# Patient Record
Sex: Male | Born: 1990 | Race: Black or African American | Hispanic: No | Marital: Single | Smoking: Current every day smoker
Health system: Southern US, Community
[De-identification: ages and names within clinical notes are randomized; demographics above are authoritative.]

## PROBLEM LIST (undated history)

## (undated) DIAGNOSIS — B20 Human immunodeficiency virus [HIV] disease: Secondary | ICD-10-CM

## (undated) HISTORY — DX: Human immunodeficiency virus (HIV) disease: B20

## (undated) HISTORY — PX: NO PAST SURGERIES: SHX2092

---
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
## 2011-02-10 ENCOUNTER — Inpatient Hospital Stay (INDEPENDENT_AMBULATORY_CARE_PROVIDER_SITE_OTHER): Admit: 2011-02-10 | Discharge: 2011-02-10 | Disposition: A | Discharge status: Home / Self Care | Payer: Medicaid Other

## 2011-02-10 ENCOUNTER — Emergency Department (HOSPITAL_COMMUNITY)
Admission: EM | Admit: 2011-02-10 | Discharge: 2011-02-11 | Discharge status: Against Medical Advice | Payer: Medicaid Other | Attending: Emergency Medicine | Admitting: Emergency Medicine

## 2011-02-10 DIAGNOSIS — R040 Epistaxis: Secondary | ICD-10-CM | POA: Insufficient documentation

## 2011-02-10 DIAGNOSIS — J3489 Other specified disorders of nose and nasal sinuses: Secondary | ICD-10-CM | POA: Insufficient documentation

## 2011-02-10 DIAGNOSIS — R51 Headache: Secondary | ICD-10-CM | POA: Insufficient documentation

## 2011-02-10 DIAGNOSIS — J019 Acute sinusitis, unspecified: Secondary | ICD-10-CM

## 2011-03-06 ENCOUNTER — Emergency Department (HOSPITAL_COMMUNITY)
Admission: EM | Admit: 2011-03-06 | Discharge: 2011-03-06 | Disposition: A | Discharge status: Home / Self Care | Payer: PRIVATE HEALTH INSURANCE | Attending: Emergency Medicine | Admitting: Emergency Medicine

## 2011-03-06 DIAGNOSIS — B86 Scabies: Secondary | ICD-10-CM | POA: Insufficient documentation

## 2011-03-06 DIAGNOSIS — R21 Rash and other nonspecific skin eruption: Secondary | ICD-10-CM | POA: Insufficient documentation

## 2011-06-15 ENCOUNTER — Emergency Department (HOSPITAL_COMMUNITY)
Admission: EM | Admit: 2011-06-15 | Discharge: 2011-06-15 | Disposition: A | Discharge status: Home / Self Care | Payer: PRIVATE HEALTH INSURANCE | Attending: Emergency Medicine | Admitting: Emergency Medicine

## 2011-06-15 DIAGNOSIS — M25569 Pain in unspecified knee: Secondary | ICD-10-CM | POA: Insufficient documentation

## 2011-06-15 DIAGNOSIS — M25529 Pain in unspecified elbow: Secondary | ICD-10-CM | POA: Insufficient documentation

## 2011-08-18 ENCOUNTER — Emergency Department (HOSPITAL_COMMUNITY): Payer: Medicaid Other

## 2011-08-18 ENCOUNTER — Emergency Department (HOSPITAL_COMMUNITY)
Admission: EM | Admit: 2011-08-18 | Discharge: 2011-08-18 | Discharge status: Against Medical Advice | Payer: Medicaid Other | Attending: Emergency Medicine | Admitting: Emergency Medicine

## 2011-08-18 DIAGNOSIS — S91109A Unspecified open wound of unspecified toe(s) without damage to nail, initial encounter: Secondary | ICD-10-CM | POA: Insufficient documentation

## 2011-08-18 DIAGNOSIS — IMO0002 Reserved for concepts with insufficient information to code with codable children: Secondary | ICD-10-CM | POA: Insufficient documentation

## 2011-08-18 DIAGNOSIS — M79609 Pain in unspecified limb: Secondary | ICD-10-CM | POA: Insufficient documentation

## 2011-08-18 DIAGNOSIS — Y92009 Unspecified place in unspecified non-institutional (private) residence as the place of occurrence of the external cause: Secondary | ICD-10-CM | POA: Insufficient documentation

## 2011-09-02 ENCOUNTER — Inpatient Hospital Stay (INDEPENDENT_AMBULATORY_CARE_PROVIDER_SITE_OTHER)
Admission: RE | Admit: 2011-09-02 | Discharge: 2011-09-02 | Disposition: A | Discharge status: Home / Self Care | Payer: Medicaid Other | Source: Ambulatory Visit | Attending: Family Medicine | Admitting: Family Medicine

## 2011-09-02 ENCOUNTER — Ambulatory Visit (INDEPENDENT_AMBULATORY_CARE_PROVIDER_SITE_OTHER): Payer: Medicaid Other

## 2011-09-02 DIAGNOSIS — M25519 Pain in unspecified shoulder: Secondary | ICD-10-CM

## 2011-09-02 DIAGNOSIS — W19XXXA Unspecified fall, initial encounter: Secondary | ICD-10-CM

## 2011-09-02 DIAGNOSIS — M79609 Pain in unspecified limb: Secondary | ICD-10-CM

## 2011-12-17 ENCOUNTER — Emergency Department (HOSPITAL_COMMUNITY)
Admission: EM | Admit: 2011-12-17 | Discharge: 2011-12-17 | Disposition: A | Discharge status: Home / Self Care | Payer: Medicaid Other | Attending: Emergency Medicine | Admitting: Emergency Medicine

## 2011-12-17 ENCOUNTER — Encounter: Payer: Self-pay | Admitting: Emergency Medicine

## 2011-12-17 DIAGNOSIS — R10819 Abdominal tenderness, unspecified site: Secondary | ICD-10-CM | POA: Insufficient documentation

## 2011-12-17 DIAGNOSIS — R197 Diarrhea, unspecified: Secondary | ICD-10-CM | POA: Insufficient documentation

## 2011-12-17 DIAGNOSIS — R509 Fever, unspecified: Secondary | ICD-10-CM | POA: Insufficient documentation

## 2011-12-17 DIAGNOSIS — R112 Nausea with vomiting, unspecified: Secondary | ICD-10-CM | POA: Insufficient documentation

## 2011-12-17 LAB — DIFFERENTIAL
Eosinophils Absolute: 0.2 10*3/uL (ref 0.0–0.7)
Eosinophils Relative: 2 % (ref 0–5)
Lymphocytes Relative: 15 % (ref 12–46)
Lymphs Abs: 1.8 10*3/uL (ref 0.7–4.0)
Monocytes Absolute: 0.8 10*3/uL (ref 0.1–1.0)
Monocytes Relative: 6 % (ref 3–12)
Neutro Abs: 9.4 10*3/uL — ABNORMAL HIGH (ref 1.7–7.7)

## 2011-12-17 LAB — CBC
Platelets: 254 10*3/uL (ref 150–400)
RDW: 11.8 % (ref 11.5–15.5)

## 2011-12-17 LAB — COMPREHENSIVE METABOLIC PANEL
ALT: 18 U/L (ref 0–53)
Albumin: 4.1 g/dL (ref 3.5–5.2)
BUN: 10 mg/dL (ref 6–23)
CO2: 23 mEq/L (ref 19–32)
Chloride: 101 mEq/L (ref 96–112)
Sodium: 136 mEq/L (ref 135–145)

## 2011-12-17 LAB — LIPASE, BLOOD: Lipase: 20 U/L (ref 11–59)

## 2011-12-17 MED ORDER — ONDANSETRON 8 MG PO TBDP
8.0000 mg | ORAL_TABLET | Freq: Once | ORAL | Status: AC
  Administered 2011-12-17: 8 mg via ORAL
  Filled 2011-12-17: qty 1

## 2011-12-17 MED ORDER — ONDANSETRON 4 MG PO TBDP: 4.0000 mg | ORAL_TABLET | Freq: Three times a day (TID) | ORAL | Status: AC | PRN

## 2011-12-17 NOTE — ED Provider Notes (Signed)
History     CSN: [id number] Arrival date & time: 12/17/2011  6:52 PM   First MD Initiated Contact with Patient 12/17/11 2107      Chief Complaint  Patient presents with  . Abdominal Pain     HPI  History provided by the patient. Patient is a 20 year old male who presents with complaints of acute onset of nausea vomiting and diarrhea symptoms began earlier this morning. Patient has associated intermittent abdominal cramping and pains radiating to mid upper back. Patient reports reduced appetite and by mouth intake today. He has not taken any medications for symptoms. He denies any aggravating or alleviating factors. Patient reports subjective fevers and chills at home. He does not know of any sick contacts with similar symptoms. Patient has history of asthma but is otherwise healthy with no significant past medical history. Patient denies cough, shortness of breath or chest pain.    Past Medical History  Diagnosis Date  . Asthma     History reviewed. No pertinent past surgical history.  History reviewed. No pertinent family history.  History  Substance Use Topics  . Smoking status: Current Everyday Smoker    Types: Cigarettes  . Smokeless tobacco: Not on file  . Alcohol Use: Yes      Review of Systems  Constitutional: Positive for fever, chills and appetite change.  HENT: Positive for sore throat. Negative for congestion and rhinorrhea.   Respiratory: Negative for cough and shortness of breath.   Cardiovascular: Negative for chest pain.  Gastrointestinal: Positive for nausea, vomiting, abdominal pain and diarrhea. Negative for constipation and blood in stool.  Genitourinary: Negative for dysuria, hematuria and flank pain.  All other systems reviewed and are negative.    Allergies  Ibuprofen  Home Medications   Current Outpatient Rx  Name Route Sig Dispense Refill  . NYQUIL COLD & FLU PO Oral Take 30 mLs by mouth 1 day or 1 dose.        BP 120/72  Pulse 100   Temp(Src) 99.2 F (37.3 C) (Oral)  Resp 16  SpO2 94%  Physical Exam  Nursing note and vitals reviewed. Constitutional: He is oriented to person, place, and time. He appears well-developed and well-nourished. No distress.  HENT:  Head: Normocephalic and atraumatic.  Mouth/Throat: Oropharynx is clear and moist.  Neck: Normal range of motion.       No meningeal signs  Cardiovascular: Normal rate, regular rhythm and normal heart sounds.   No murmur heard. Pulmonary/Chest: Effort normal and breath sounds normal. No respiratory distress. He has no wheezes. He has no rales.  Abdominal: Soft. He exhibits no distension. There is no hepatosplenomegaly. There is no rebound, no guarding, no CVA tenderness, no tenderness at McBurney's point and negative Murphy's sign.       Mild diffuse tenderness  Neurological: He is alert and oriented to person, place, and time.  Skin: Skin is warm. No rash noted.  Psychiatric: His behavior is normal.    ED Course  Procedures (including critical care time)  Labs Reviewed  CBC - Abnormal; Notable for the following:    WBC 12.1 (*)    All other components within normal limits  DIFFERENTIAL - Abnormal; Notable for the following:    Neutro Abs 9.4 (*)    All other components within normal limits  COMPREHENSIVE METABOLIC PANEL  LIPASE, BLOOD    Results for orders placed during the hospital encounter of 12/17/11  CBC      Component Value Range  WBC 12.1 (*) 4.0 - 10.5 (K/uL)   RBC 4.90  4.22 - 5.81 (MIL/uL)   Hemoglobin 15.4  13.0 - 17.0 (g/dL)   HCT 29.5  28.4 - 13.2 (%)   MCV 91.2  78.0 - 100.0 (fL)   MCH 31.4  26.0 - 34.0 (pg)   MCHC 34.5  30.0 - 36.0 (g/dL)   RDW 44.0  10.2 - 72.5 (%)   Platelets 254  150 - 400 (K/uL)  DIFFERENTIAL      Component Value Range   Neutrophils Relative 77  43 - 77 (%)   Neutro Abs 9.4 (*) 1.7 - 7.7 (K/uL)   Lymphocytes Relative 15  12 - 46 (%)   Lymphs Abs 1.8  0.7 - 4.0 (K/uL)   Monocytes Relative 6  3 - 12  (%)   Monocytes Absolute 0.8  0.1 - 1.0 (K/uL)   Eosinophils Relative 2  0 - 5 (%)   Eosinophils Absolute 0.2  0.0 - 0.7 (K/uL)   Basophils Relative 0  0 - 1 (%)   Basophils Absolute 0.0  0.0 - 0.1 (K/uL)  COMPREHENSIVE METABOLIC PANEL      Component Value Range   Sodium 136  135 - 145 (mEq/L)   Potassium 3.7  3.5 - 5.1 (mEq/L)   Chloride 101  96 - 112 (mEq/L)   CO2 23  19 - 32 (mEq/L)   Glucose, Bld 91  70 - 99 (mg/dL)   BUN 10  6 - 23 (mg/dL)   Creatinine, Ser 3.66  0.50 - 1.35 (mg/dL)   Calcium 9.5  8.4 - 44.0 (mg/dL)   Total Protein 7.2  6.0 - 8.3 (g/dL)   Albumin 4.1  3.5 - 5.2 (g/dL)   AST 20  0 - 37 (U/L)   ALT 18  0 - 53 (U/L)   Alkaline Phosphatase 94  39 - 117 (U/L)   Total Bilirubin 0.6  0.3 - 1.2 (mg/dL)   GFR calc non Af Amer >90  >90 (mL/min)   GFR calc Af Amer >90  >90 (mL/min)  LIPASE, BLOOD      Component Value Range   Lipase 20  11 - 59 (U/L)     1. Nausea vomiting and diarrhea       MDM  9:00 PM patient seen and evaluated. Patient no acute distress. Patient with acute onset of nausea vomiting diarrhea symptoms earlier today and intermittent abdominal cramping.   11 PM she reports feeling better he denies any abdominal pain. On reexamination abdomen is soft without any significant tenderness. She has no peritoneal signs. Patient is tolerating by mouth fluids. At this time will discharge home with strict return precautions.     Angus Seller, PA 12/17/11 [phone]

## 2011-12-17 NOTE — ED Provider Notes (Signed)
Medical screening examination/treatment/procedure(s) were performed by non-physician practitioner and as supervising physician I was immediately available for consultation/collaboration.  Juliet Rude. Rubin Payor, MD 12/17/11 [phone]

## 2011-12-17 NOTE — ED Notes (Signed)
Pt reports medial abd pain that began this a.m. - pt also w/ n/v/d and fever that began this a.m. As well. Pt in no acute distress at present. Friends at bedside. Pt denies any blood noted in emesis or diarrhea.

## 2011-12-17 NOTE — ED Notes (Signed)
Pt states having abd pain radiates to back

## 2011-12-17 NOTE — ED Notes (Signed)
Rx given x1 D/c instructions reviewed w/ pt - pt denies any further questions or concerns at present.   

## 2012-08-13 IMAGING — CR DG SHOULDER 2+V*R*
4 series · 4 of 4 positions shown · non-contrast
Comparison: None.

CLINICAL DATA: Pain post fall

RIGHT SHOULDER - 2+ VIEW

[view not recorded (1 of 4)]
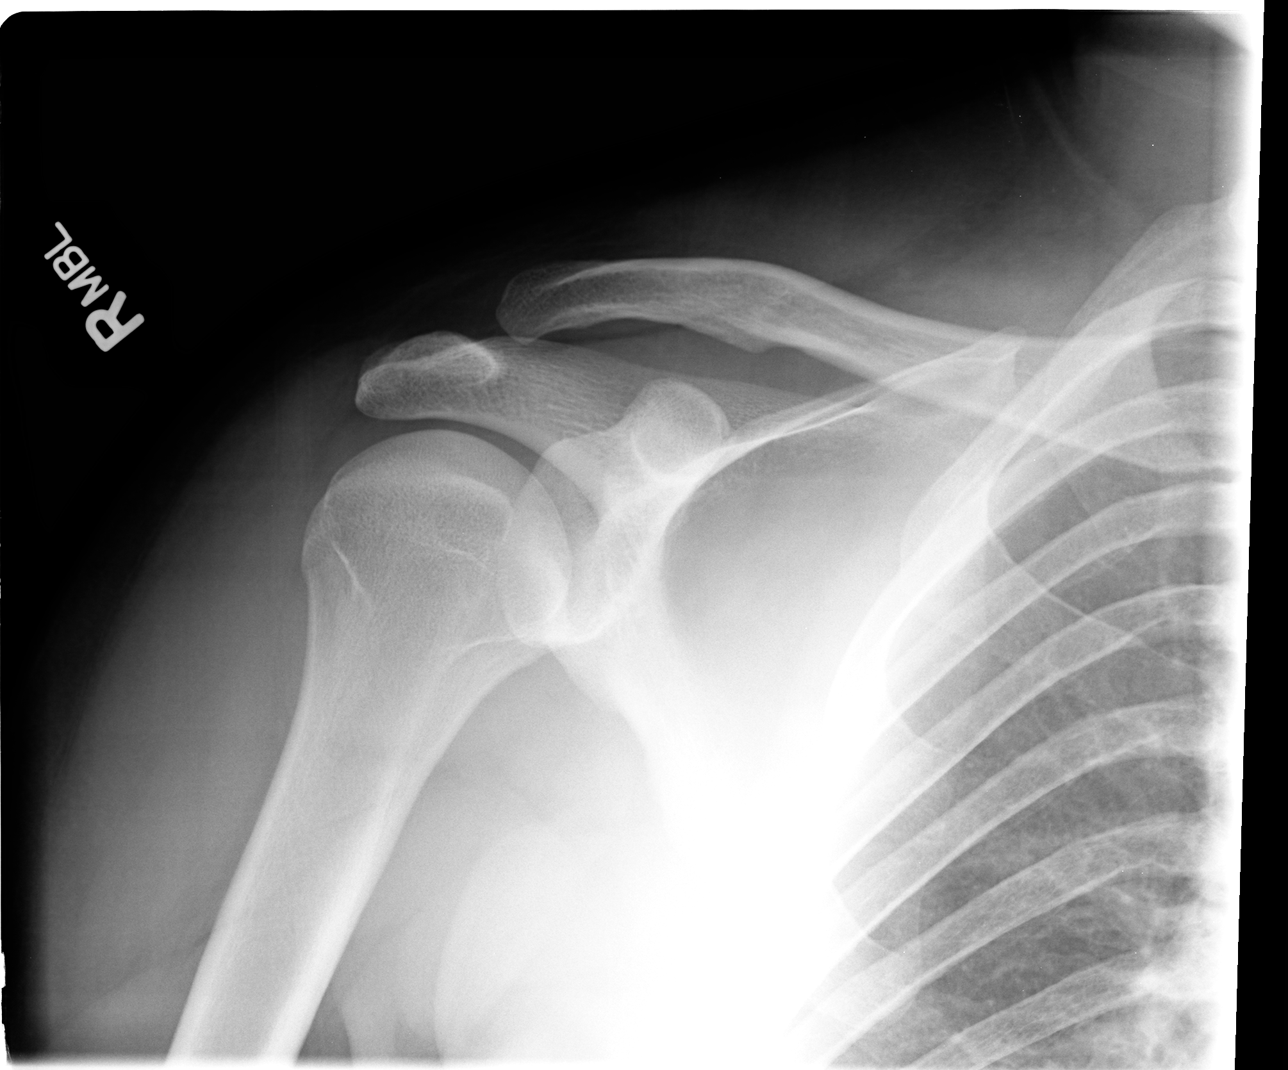

[view not recorded (2 of 4)]
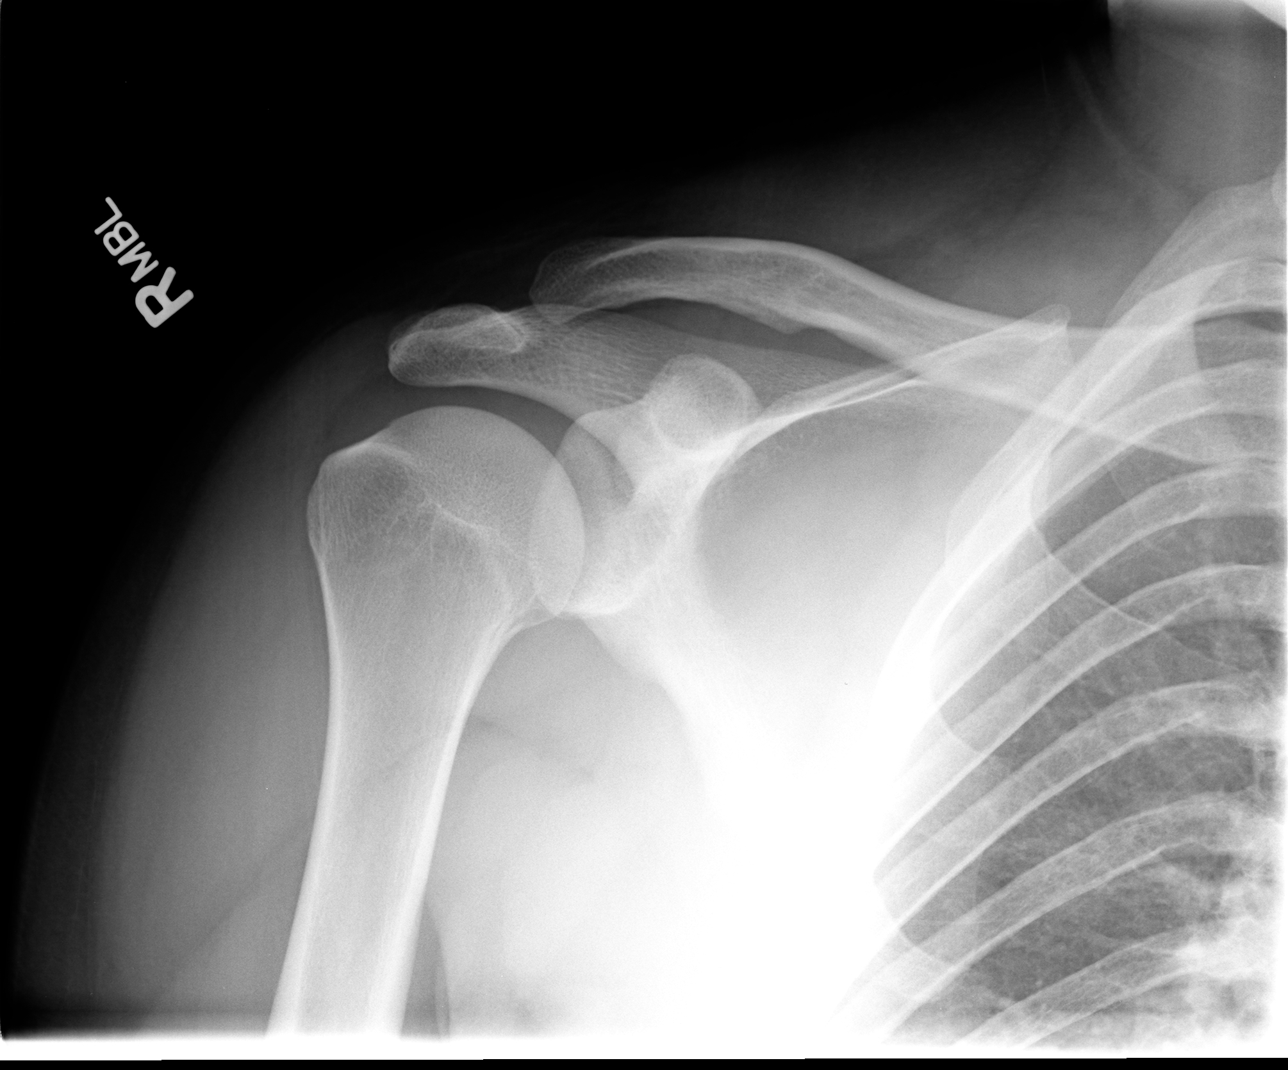

[view not recorded (3 of 4)]
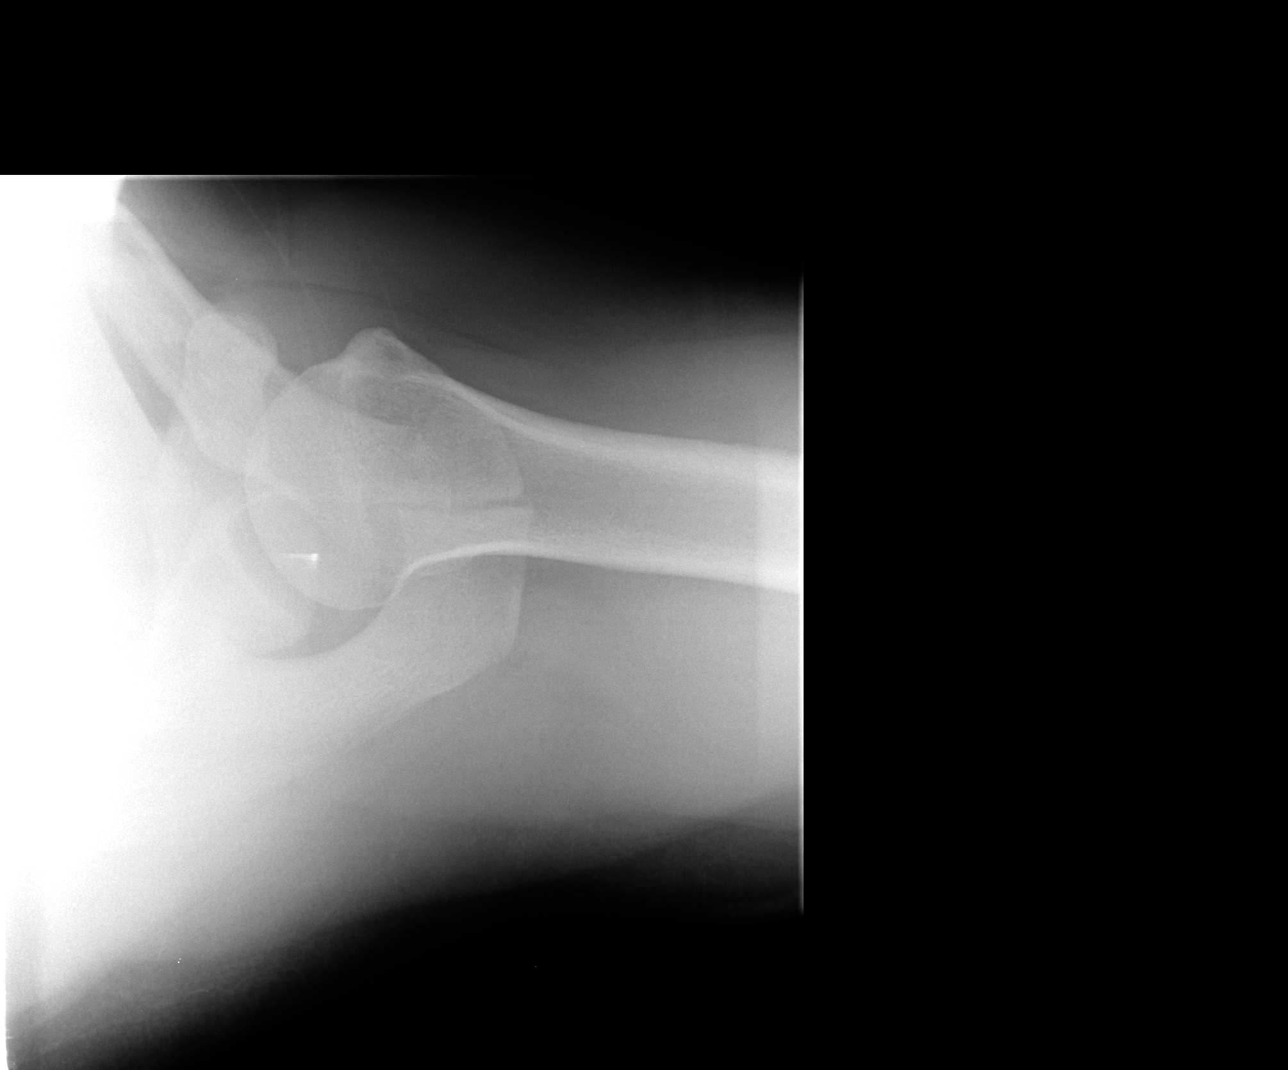

[view not recorded (4 of 4)]
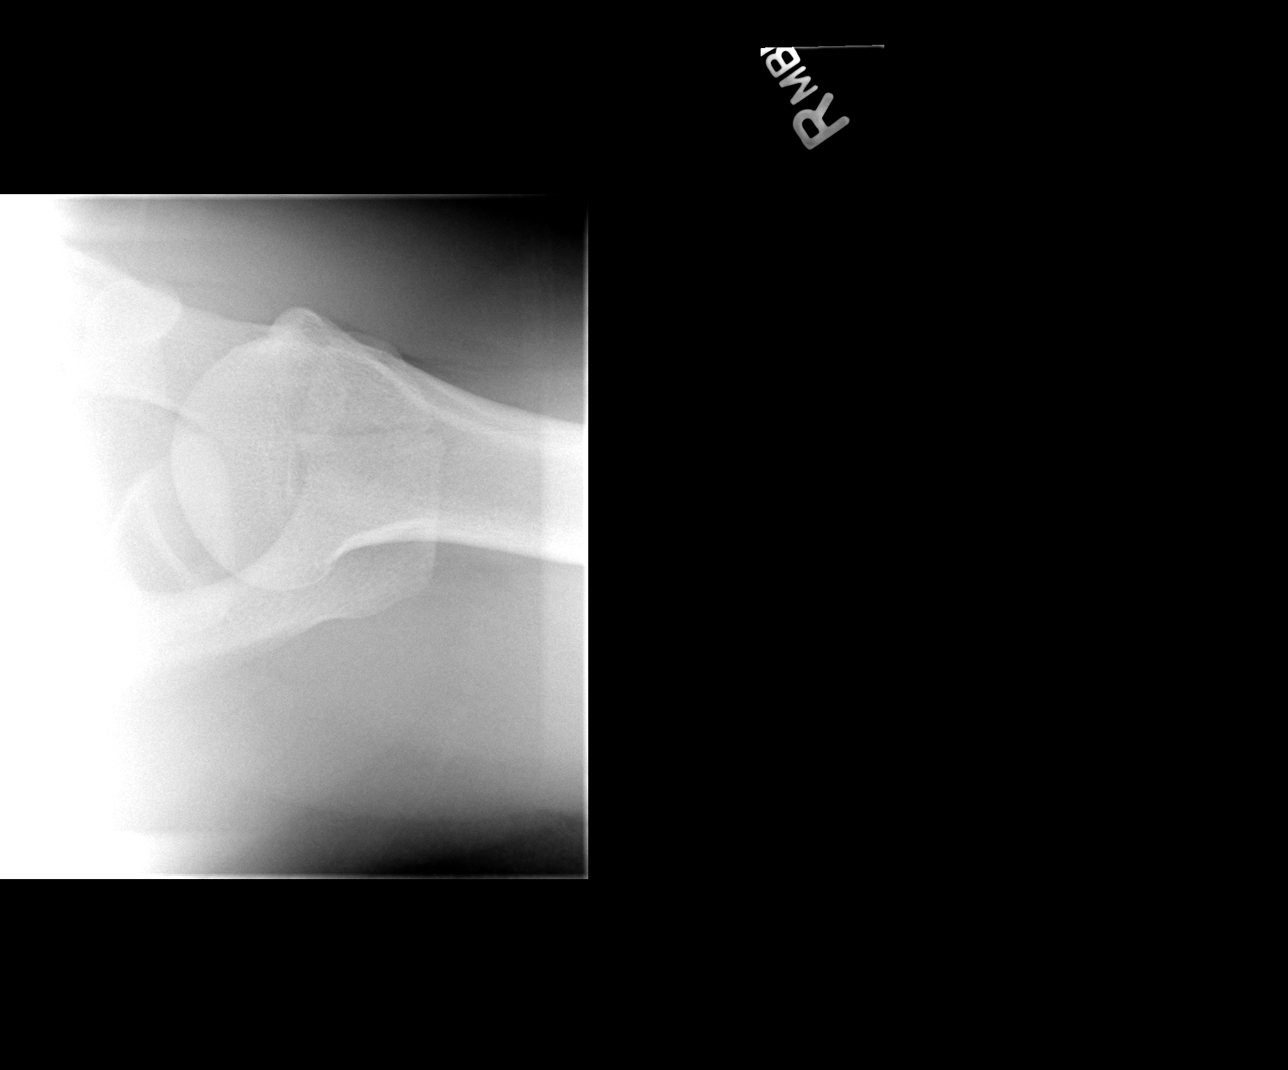

[4 of 4 positions shown; findings below may reference images not displayed]

FINDINGS: Four views of the right shoulder submitted.  No acute
fracture or subluxation.  No radiopaque foreign body.
IMPRESSION: No acute fracture or subluxation.

## 2012-12-11 ENCOUNTER — Emergency Department (INDEPENDENT_AMBULATORY_CARE_PROVIDER_SITE_OTHER)
Admission: EM | Admit: 2012-12-11 | Discharge: 2012-12-11 | Disposition: A | Discharge status: Home / Self Care | Payer: Medicaid Other | Source: Home / Self Care | Attending: Emergency Medicine | Admitting: Emergency Medicine

## 2012-12-11 ENCOUNTER — Encounter (HOSPITAL_COMMUNITY): Payer: Self-pay | Admitting: *Deleted

## 2012-12-11 DIAGNOSIS — R21 Rash and other nonspecific skin eruption: Secondary | ICD-10-CM

## 2012-12-11 MED ORDER — PREDNISONE 10 MG PO TABS: ORAL_TABLET | ORAL | Status: DC

## 2012-12-11 NOTE — ED Provider Notes (Signed)
History     CSN: [id number]  Arrival date & time 12/11/12  1205   First MD Initiated Contact with Patient 12/11/12 1251      Chief Complaint  Patient presents with  . Rash    (Consider location/radiation/quality/duration/timing/severity/associated sxs/prior treatment) Patient is a 21 y.o. male presenting with rash. The history is provided by the patient. No language interpreter was used.  Rash  This is a new problem. Episode onset: 4 days ago. The problem has not changed since onset.The problem is associated with nothing. There has been no fever. The rash is present on the torso. The pain is at a severity of 5/10. The pain is moderate. The pain has been constant since onset. Associated symptoms include itching. He has tried nothing for the symptoms. Risk factors: no new product or food.    Past Medical History  Diagnosis Date  . Asthma     History reviewed. No pertinent past surgical history.  No family history on file.  History  Substance Use Topics  . Smoking status: Current Every Day Smoker    Types: Cigarettes  . Smokeless tobacco: Not on file  . Alcohol Use: Yes      Review of Systems  Skin: Positive for itching and rash.  All other systems reviewed and are negative.    Allergies  Ibuprofen  Home Medications   Current Outpatient Rx  Name  Route  Sig  Dispense  Refill  . ALBUTEROL IN   Inhalation   Inhale into the lungs.         Doreatha Martin COLD & FLU PO   Oral   Take 30 mLs by mouth 1 day or 1 dose.             BP 119/78  Pulse 85  Temp 98.4 F (36.9 C) (Oral)  Resp 18  SpO2 97%  Physical Exam  Nursing note and vitals reviewed. Constitutional: He appears well-developed and well-nourished.  HENT:  Head: Normocephalic.  Eyes: Pupils are equal, round, and reactive to light.  Neck: Normal range of motion.  Cardiovascular: Normal rate.   Pulmonary/Chest: Effort normal.  Abdominal: Soft.  Musculoskeletal: Normal range of motion.   Neurological: He is alert.  Skin: Rash noted.  Psychiatric: He has a normal mood and affect.    ED Course  Procedures (including critical care time)  Labs Reviewed - No data to display No results found.   1. Rash       MDM  Prednisone taper and benadryl        Lonia Skinner [place], [place] 12/11/12 1349

## 2012-12-11 NOTE — ED Notes (Signed)
Pt  Reports  About  4  Days  Ago  He  Developed  Itching  Which  He  Describes       As  Spreading  From  Back  To  Torso     He  Also  Describes  A  Knot on his  Upper  Back as  Well    - pt  denys  Any known  Causative  Agents  Or any new meds

## 2012-12-11 NOTE — ED Provider Notes (Signed)
Medical screening examination/treatment/procedure(s) were performed by non-physician practitioner and as supervising physician I was immediately available for consultation/collaboration.  Blessin Kanno, M.D.   Carver Murakami C Lakechia Nay, MD 12/11/12 2245 

## 2017-11-02 ENCOUNTER — Emergency Department (HOSPITAL_COMMUNITY): Payer: Self-pay

## 2017-11-02 ENCOUNTER — Emergency Department (HOSPITAL_COMMUNITY)
Admission: EM | Admit: 2017-11-02 | Discharge: 2017-11-02 | Disposition: A | Discharge status: Home / Self Care | Payer: Self-pay | Attending: Emergency Medicine | Admitting: Emergency Medicine

## 2017-11-02 ENCOUNTER — Encounter (HOSPITAL_COMMUNITY): Payer: Self-pay | Admitting: Emergency Medicine

## 2017-11-02 DIAGNOSIS — J020 Streptococcal pharyngitis: Secondary | ICD-10-CM | POA: Insufficient documentation

## 2017-11-02 DIAGNOSIS — J45909 Unspecified asthma, uncomplicated: Secondary | ICD-10-CM | POA: Insufficient documentation

## 2017-11-02 DIAGNOSIS — F17210 Nicotine dependence, cigarettes, uncomplicated: Secondary | ICD-10-CM | POA: Insufficient documentation

## 2017-11-02 LAB — RAPID STREP SCREEN (MED CTR MEBANE ONLY): STREPTOCOCCUS, GROUP A SCREEN (DIRECT): POSITIVE — AB

## 2017-11-02 MED ORDER — AMOXICILLIN 500 MG PO CAPS: 500.0000 mg | ORAL_CAPSULE | Freq: Two times a day (BID) | ORAL | 0 refills | Status: DC

## 2017-11-02 MED ORDER — ALBUTEROL SULFATE (2.5 MG/3ML) 0.083% IN NEBU
INHALATION_SOLUTION | RESPIRATORY_TRACT | Status: AC
  Administered 2017-11-02: 18:00:00
  Filled 2017-11-02: qty 3

## 2017-11-02 MED ORDER — BENZONATATE 100 MG PO CAPS: 100.0000 mg | ORAL_CAPSULE | Freq: Every evening | ORAL | 0 refills | Status: DC | PRN

## 2017-11-02 MED ORDER — DEXAMETHASONE SODIUM PHOSPHATE 10 MG/ML IJ SOLN
10.0000 mg | Freq: Once | INTRAMUSCULAR | Status: AC
  Administered 2017-11-02: 10 mg via INTRAMUSCULAR
  Filled 2017-11-02: qty 1

## 2017-11-02 MED ORDER — ALBUTEROL SULFATE (2.5 MG/3ML) 0.083% IN NEBU
5.0000 mg | INHALATION_SOLUTION | Freq: Once | RESPIRATORY_TRACT | Status: AC
  Administered 2017-11-02: 5 mg via RESPIRATORY_TRACT

## 2017-11-02 MED ORDER — ALBUTEROL SULFATE HFA 108 (90 BASE) MCG/ACT IN AERS
2.0000 | INHALATION_SPRAY | Freq: Once | RESPIRATORY_TRACT | Status: AC
  Administered 2017-11-02: 2 via RESPIRATORY_TRACT
  Filled 2017-11-02: qty 6.7

## 2017-11-02 NOTE — ED Provider Notes (Signed)
[facility] Provider Note   CSN: [id number] Arrival date & time: 11/02/17  1640     History   Chief Complaint Chief Complaint  Patient presents with  . URI  . Asthma  . Shortness of Breath    HPI Maurice Young is a 26 y.o. male.  The history is provided by the patient and medical records. No language interpreter was used.   Maurice Young is a 26 y.o. male  with a PMH of asthma who presents to the Emergency Department complaining of persistent productive cough, chills, sore throat and nasal congestion. This morning, he awoke and felt like he was short of breath. Hx of asthma, but has not had to use inhalers in several years. Tried Tylenol last night and Dayquil today - did not feel much relief from either. Denies aggravating factors. Denies fever, chest pain, back pain, urinary symptoms or body aches.  Past Medical History:  Diagnosis Date  . Asthma     There are no active problems to display for this patient.   No past surgical history on file.     Home Medications    Prior to Admission medications   Medication Sig Start Date End Date Taking? Authorizing Provider  ALBUTEROL IN Inhale into the lungs.    [provider]  amoxicillin (AMOXIL) 500 MG capsule Take 1 capsule (500 mg total) by mouth 2 (two) times daily. 11/02/17   Kalese Ensz, Chase PicketJaime Pilcher, PA-C  benzonatate (TESSALON) 100 MG capsule Take 1 capsule (100 mg total) by mouth at bedtime as needed for cough. 11/02/17   Maylin Freeburg, Chase PicketJaime Pilcher, PA-C  DM-Doxylamine-Acetaminophen (NYQUIL COLD & FLU PO) Take 30 mLs by mouth 1 day or 1 dose.      [provider]  predniSONE (DELTASONE) 10 MG tablet 6,5,4,3,2, 1 taper 12/11/12   Elson AreasSofia, Leslie K, PA-C    Family History No family history on file.  Social History Social History  Substance Use Topics  . Smoking status: Current Every Day Smoker    Types: Cigarettes  . Smokeless tobacco: Not on file  . Alcohol use Yes      Allergies   Ibuprofen   Review of Systems Review of Systems  Constitutional: Positive for chills. Negative for fever.  HENT: Positive for congestion and sore throat.   Respiratory: Positive for cough and shortness of breath.   Cardiovascular: Negative for chest pain, palpitations and leg swelling.  All other systems reviewed and are negative.    Physical Exam Updated Vital Signs BP 135/69   Pulse 100   Temp 98.7 F (37.1 C) (Oral)   Resp 20   SpO2 98%   Physical Exam  Constitutional: He is oriented to person, place, and time. He appears well-developed and well-nourished. No distress.  HENT:  Head: Normocephalic and atraumatic.  OP with erythema and mild tonsillar hypertrophy. No exudates. + nasal congestion with mucosal edema.   Neck: Normal range of motion. Neck supple.  No meningeal signs.   Cardiovascular: Normal rate, regular rhythm and normal heart sounds.   Pulmonary/Chest: Effort normal.  Lungs are clear to auscultation bilaterally - no w/r/r  Abdominal: Soft. He exhibits no distension. There is no tenderness.  Musculoskeletal: Normal range of motion.  Neurological: He is alert and oriented to person, place, and time.  Skin: Skin is warm and dry. He is not diaphoretic.  Nursing note and vitals reviewed.    ED Treatments / Results  Labs (all labs ordered are listed, but  only abnormal results are displayed) Labs Reviewed  RAPID STREP SCREEN (NOT AT [facility]) - Abnormal; Notable for the following:       Result Value   Streptococcus, Group A Screen (Direct) POSITIVE (*)    All other components within normal limits    EKG  EKG Interpretation None       Radiology Dg Chest 2 View  Result Date: 11/02/2017 CLINICAL DATA:  26 year old male with chest tightness, cough and shortness of breath for 2 days. EXAM: CHEST  2 VIEW COMPARISON:  None. FINDINGS: The heart size and mediastinal contours are within normal limits. There is elevation of the left  hemidiaphragm. Both lungs are clear. The visualized skeletal structures are unremarkable. IMPRESSION: No active cardiopulmonary disease. Electronically Signed   By: Sande Brothers M.D.   On: 11/02/2017 17:39    Procedures Procedures (including critical care time)  Medications Ordered in ED Medications  albuterol (PROVENTIL) (2.5 MG/3ML) 0.083% nebulizer solution 5 mg (5 mg Nebulization Given 11/02/17 1703)  albuterol (PROVENTIL) (2.5 MG/3ML) 0.083% nebulizer solution (  Given by Other 11/02/17 1800)  albuterol (PROVENTIL HFA;VENTOLIN HFA) 108 (90 Base) MCG/ACT inhaler 2 puff (2 puffs Inhalation Given 11/02/17 1945)  dexamethasone (DECADRON) injection 10 mg (10 mg Intramuscular Given 11/02/17 1945)     Initial Impression / Assessment and Plan / ED Course  I have reviewed the triage vital signs and the nursing notes.  Pertinent labs & imaging results that were available during my care of the patient were reviewed by me and considered in my medical decision making (see chart for details).    Maurice Young is a 26 y.o. male who presents to ED for cough, congestion, sore throat. Afebrile upon ED arrival. Feels like congestion and breathing improved following neb treatment given in triage. Lungs CTA on my evaluation. OP with erythema and mild tonsillar hypertrophy. Tolerating secretions well. 98% O2 on RA. CXR negative. Rapid strep positive. Will treat with amoxil. Decadron given in ED. Albuterol inhaler provided. Reasons to return to ER discussed and all questions answered.   Final Clinical Impressions(s) / ED Diagnoses   Final diagnoses:  Strep pharyngitis    New Prescriptions Discharge Medication List as of 11/02/2017  8:35 PM    START taking these medications   Details  amoxicillin (AMOXIL) 500 MG capsule Take 1 capsule (500 mg total) by mouth 2 (two) times daily., Starting Sat 11/02/2017, Print    benzonatate (TESSALON) 100 MG capsule Take 1 capsule (100 mg total) by mouth at bedtime as  needed for cough., Starting Sat 11/02/2017, Print         Teala Daffron, Chase Picket, PA-C 11/02/17 2219    Nira Conn, MD 11/03/17 [phone]

## 2017-11-02 NOTE — Discharge Instructions (Signed)
You have strep throat or pharyngitis. Please take all of your antibiotics until finished! It is very important that you complete the entire course of this medication or the strep may not completely be treated.  Also discard your toothbrush and begin using a new one in 3 days. For sore throat, take ibuprofen every 6 hours as needed. Follow up with your doctor in 2-3 days if no improvement. Return to the ED sooner for worsening condition, inability to swallow, breathing difficulty, new concerns.

## 2017-11-02 NOTE — ED Triage Notes (Addendum)
Pt states hx of asthma. 2-3 days of shortness of breath, runny nose, post nasal drip, sore throat. Productive cough with yellow/green mucous. Has not used inhaler in years, does not have one anymore. Pt states pain worse with coughing.

## 2018-01-06 ENCOUNTER — Encounter (HOSPITAL_COMMUNITY): Payer: Self-pay | Admitting: Emergency Medicine

## 2018-01-06 ENCOUNTER — Emergency Department (HOSPITAL_COMMUNITY)
Admission: EM | Admit: 2018-01-06 | Discharge: 2018-01-06 | Disposition: A | Discharge status: Home / Self Care | Payer: Self-pay | Attending: Emergency Medicine | Admitting: Emergency Medicine

## 2018-01-06 DIAGNOSIS — J45909 Unspecified asthma, uncomplicated: Secondary | ICD-10-CM | POA: Insufficient documentation

## 2018-01-06 DIAGNOSIS — R509 Fever, unspecified: Secondary | ICD-10-CM | POA: Insufficient documentation

## 2018-01-06 DIAGNOSIS — J111 Influenza due to unidentified influenza virus with other respiratory manifestations: Secondary | ICD-10-CM

## 2018-01-06 DIAGNOSIS — F17210 Nicotine dependence, cigarettes, uncomplicated: Secondary | ICD-10-CM | POA: Insufficient documentation

## 2018-01-06 DIAGNOSIS — R69 Illness, unspecified: Secondary | ICD-10-CM

## 2018-01-06 MED ORDER — OSELTAMIVIR PHOSPHATE 75 MG PO CAPS
75.0000 mg | ORAL_CAPSULE | Freq: Once | ORAL | Status: AC
  Administered 2018-01-06: 75 mg via ORAL
  Filled 2018-01-06: qty 1

## 2018-01-06 MED ORDER — OSELTAMIVIR PHOSPHATE 75 MG PO CAPS: 75.0000 mg | ORAL_CAPSULE | Freq: Two times a day (BID) | ORAL | 0 refills | Status: DC

## 2018-01-06 MED ORDER — ACETAMINOPHEN 325 MG PO TABS
325.0000 mg | ORAL_TABLET | Freq: Once | ORAL | Status: AC
  Administered 2018-01-06: 325 mg via ORAL
  Filled 2018-01-06: qty 1

## 2018-01-06 MED ORDER — TRAMADOL HCL 50 MG PO TABS: 50.0000 mg | ORAL_TABLET | Freq: Four times a day (QID) | ORAL | 0 refills | Status: DC | PRN

## 2018-01-06 MED ORDER — ACETAMINOPHEN 325 MG PO TABS
650.0000 mg | ORAL_TABLET | Freq: Once | ORAL | Status: AC | PRN
  Administered 2018-01-06: 650 mg via ORAL
  Filled 2018-01-06: qty 2

## 2018-01-06 NOTE — ED Provider Notes (Signed)
[facility]-EMERGENCY DEPT Provider Note   CSN: [id number] Arrival date & time: 01/06/18  1315     History   Chief Complaint Chief Complaint  Patient presents with  . Flu Like Symptoms    HPI   Blood pressure 139/85, pulse (!) 102, temperature (!) 102.4 F (39.1 C), temperature source Oral, resp. rate 18, height 6\' 2"  (1.88 m), weight 108.9 kg (240 lb), SpO2 100 %.  Maurice Young is a 27 y.o. male complaining of acute onset of tactile fever, chills, rhinorrhea, headache, sore throat and diffuse myalgia onset yesterday.  He denies cough, shortness of breath, wheezing or asthma exacerbation.  He denies any focal chest or abdominal pain.  No sick contacts, he did not have a flu shot this year.  Past Medical History:  Diagnosis Date  . Asthma     There are no active problems to display for this patient.   History reviewed. No pertinent surgical history.     Home Medications    Prior to Admission medications   Medication Sig Start Date End Date Taking? Authorizing Provider  albuterol (PROVENTIL HFA;VENTOLIN HFA) 108 (90 Base) MCG/ACT inhaler Inhale 2 puffs into the lungs every 6 (six) hours as needed for wheezing or shortness of breath.   Yes [provider]  amoxicillin (AMOXIL) 500 MG capsule Take 1 capsule (500 mg total) by mouth 2 (two) times daily. Patient not taking: Reported on 01/06/2018 11/02/17   Ward, Chase PicketJaime Pilcher, PA-C  oseltamivir (TAMIFLU) 75 MG capsule Take 1 capsule (75 mg total) by mouth every 12 (twelve) hours. 01/06/18   Zaydyn Havey, Joni ReiningNicole, PA-C  predniSONE (DELTASONE) 10 MG tablet 6,5,4,3,2, 1 taper Patient not taking: Reported on 01/06/2018 12/11/12   Elson AreasSofia, Leslie K, PA-C  traMADol (ULTRAM) 50 MG tablet Take 1 tablet (50 mg total) by mouth every 6 (six) hours as needed. 01/06/18   Airiel Oblinger, Mardella LaymanNicole, PA-C    Family History No family history on file.  Social History Social History   Tobacco Use  . Smoking status: Current Every  Day Smoker    Packs/day: 0.50    Types: Cigarettes  . Smokeless tobacco: Never Used  Substance Use Topics  . Alcohol use: Yes  . Drug use: No     Allergies   Ibuprofen   Review of Systems Review of Systems  A complete review of systems was obtained and all systems are negative except as noted in the HPI and PMH.   Physical Exam Updated Vital Signs BP 139/85 (BP Location: Left Arm)   Pulse (!) 102   Temp (!) 102.4 F (39.1 C) (Oral)   Resp 18   Ht 6\' 2"  (1.88 m)   Wt 108.9 kg (240 lb)   SpO2 100%   BMI 30.81 kg/m   Physical Exam  Constitutional: He appears well-developed and well-nourished.  HENT:  Head: Normocephalic.  Right Ear: External ear normal.  Left Ear: External ear normal.  Mouth/Throat: Oropharynx is clear and moist. No oropharyngeal exudate.  No drooling or stridor. Posterior pharynx mildly erythematous no significant tonsillar hypertrophy. No exudate. Soft palate rises symmetrically. No TTP or induration under tongue.   No tenderness to palpation of frontal or bilateral maxillary sinuses.  Mild mucosal edema in the nares with scant rhinorrhea.  Bilateral tympanic membranes with normal architecture and good light reflex.    Eyes: Conjunctivae and EOM are normal. Pupils are equal, round, and reactive to light.  Neck: Normal range of motion. Neck supple.  Cardiovascular: Normal rate  and regular rhythm.  Pulmonary/Chest: Effort normal and breath sounds normal. No stridor. No respiratory distress. He has no wheezes. He has no rales. He exhibits no tenderness.  Abdominal: Soft. There is no tenderness. There is no rebound and no guarding.  Nursing note and vitals reviewed.    ED Treatments / Results  Labs (all labs ordered are listed, but only abnormal results are displayed) Labs Reviewed - No data to display  EKG  EKG Interpretation None       Radiology No results found.  Procedures Procedures (including critical care time)  Medications  Ordered in ED Medications  oseltamivir (TAMIFLU) capsule 75 mg (not administered)  acetaminophen (TYLENOL) tablet 650 mg (650 mg Oral Given 01/06/18 1420)     Initial Impression / Assessment and Plan / ED Course  I have reviewed the triage vital signs and the nursing notes.  Pertinent labs & imaging results that were available during my care of the patient were reviewed by me and considered in my medical decision making (see chart for details).     Vitals:   01/06/18 1414  BP: 139/85  Pulse: (!) 102  Resp: 18  Temp: (!) 102.4 F (39.1 C)  TempSrc: Oral  SpO2: 100%  Weight: 108.9 kg (240 lb)  Height: 6\' 2"  (1.88 m)    Medications  oseltamivir (TAMIFLU) capsule 75 mg (not administered)  acetaminophen (TYLENOL) tablet 650 mg (650 mg Oral Given 01/06/18 1420)     Maurice Young is 27 y.o. male presenting with influenza-like illness, lung sounds clear to auscultation, he saturating well on room air, mild tachycardia likely related to his fever of 102.4.  Patient with underlying asthma, will initiate Tamiflu as per CDC guidelines.  We have had an extensive discussion on aggressive hydration, tramadol for pain control at home, work note provided and extensive discussion of return precautions.  Evaluation does not show pathology that would require ongoing emergent intervention or inpatient treatment. Pt is hemodynamically stable and mentating appropriately. Discussed findings and plan with patient/guardian, who agrees with care plan. All questions answered. Return precautions discussed and outpatient follow up given.    Final Clinical Impressions(s) / ED Diagnoses   Final diagnoses:  Influenza-like illness    ED Discharge Orders        Ordered    oseltamivir (TAMIFLU) 75 MG capsule  Every 12 hours     01/06/18 1457    traMADol (ULTRAM) 50 MG tablet  Every 6 hours PRN     01/06/18 1457       Farren Landa, Mardella Layman 01/06/18 1511    Derwood Kaplan, MD 01/07/18 [phone]

## 2018-01-06 NOTE — Discharge Instructions (Signed)
Return to the emergency room for any worsening or concerning symptoms including fast breathing, heart racing, confusion, vomiting.  Rest, cover your mouth when you cough and wash your hands frequently.   Push fluids: water or Gatorade, do not drink any soda, juice or caffeinated beverages.  For fever and pain control you can take Motrin (ibuprofen) as follows: 400 mg (this is normally 2 over the counter pills) every 4 hours with food.  Do not return to work until a day after your fever breaks.   For fever control you can take 650 mg of acetaminophen (Tylenol) this is normally 2 over the counter pills; every 4-6 hours, do not take any other medications that have acetaminophen as an ingredient. Do not drink alcohol.

## 2018-01-06 NOTE — ED Triage Notes (Signed)
Pt comes in with complaints of flu like symptoms that started yesterday at work.  Pt febrile on assessment. Complains of congestion and head pressure. Denies nausea and vomiting.  Took an advil yesterday for headache but has not taken anything for cold symptoms. Ambulatory. A&O x4.

## 2018-01-06 NOTE — ED Notes (Signed)
Patient provided with additionally ordered tylenol and educated on fever care. EDPA Joni Reiningicole states patient is safe to discharge. Patient verbalizes understanding. Discharge instructions reviewed with patient. Patient verbalizes understanding.

## 2018-02-02 ENCOUNTER — Encounter (HOSPITAL_COMMUNITY): Payer: Self-pay

## 2018-02-02 ENCOUNTER — Other Ambulatory Visit: Payer: Self-pay

## 2018-02-02 ENCOUNTER — Emergency Department (HOSPITAL_COMMUNITY)
Admission: EM | Admit: 2018-02-02 | Discharge: 2018-02-02 | Disposition: A | Discharge status: Home / Self Care | Payer: Self-pay | Attending: Emergency Medicine | Admitting: Emergency Medicine

## 2018-02-02 DIAGNOSIS — R197 Diarrhea, unspecified: Secondary | ICD-10-CM | POA: Insufficient documentation

## 2018-02-02 DIAGNOSIS — R112 Nausea with vomiting, unspecified: Secondary | ICD-10-CM | POA: Insufficient documentation

## 2018-02-02 DIAGNOSIS — F17210 Nicotine dependence, cigarettes, uncomplicated: Secondary | ICD-10-CM | POA: Insufficient documentation

## 2018-02-02 DIAGNOSIS — J45909 Unspecified asthma, uncomplicated: Secondary | ICD-10-CM | POA: Insufficient documentation

## 2018-02-02 DIAGNOSIS — R1010 Upper abdominal pain, unspecified: Secondary | ICD-10-CM | POA: Insufficient documentation

## 2018-02-02 DIAGNOSIS — E876 Hypokalemia: Secondary | ICD-10-CM | POA: Insufficient documentation

## 2018-02-02 LAB — COMPREHENSIVE METABOLIC PANEL
ALBUMIN: 3.6 g/dL (ref 3.5–5.0)
ALK PHOS: 65 U/L (ref 38–126)
ALT: 60 U/L (ref 17–63)
AST: 30 U/L (ref 15–41)
Anion gap: 7 (ref 5–15)
BUN: 7 mg/dL (ref 6–20)
CHLORIDE: 106 mmol/L (ref 101–111)
CO2: 23 mmol/L (ref 22–32)
Calcium: 8.5 mg/dL — ABNORMAL LOW (ref 8.9–10.3)
Creatinine, Ser: 0.93 mg/dL (ref 0.61–1.24)
GFR calc Af Amer: >60 mL/min (ref >60)
GFR calc non Af Amer: >60 mL/min (ref >60)
GLUCOSE: 89 mg/dL (ref 65–99)
POTASSIUM: 3.1 mmol/L — AB (ref 3.5–5.1)
SODIUM: 136 mmol/L (ref 135–145)
Total Bilirubin: 0.5 mg/dL (ref 0.3–1.2)
Total Protein: 6.7 g/dL (ref 6.5–8.1)

## 2018-02-02 LAB — CBC WITH DIFFERENTIAL/PLATELET
BASOS ABS: 0.1 10*3/uL (ref 0.0–0.1)
Basophils Relative: 1 %
EOS ABS: 0.1 10*3/uL (ref 0.0–0.7)
Eosinophils Relative: 1 %
HCT: 41.8 % (ref 39.0–52.0)
HEMOGLOBIN: 14.4 g/dL (ref 13.0–17.0)
LYMPHS PCT: 37 %
Lymphs Abs: 3.2 10*3/uL (ref 0.7–4.0)
MCH: 31.3 pg (ref 26.0–34.0)
MCHC: 34.4 g/dL (ref 30.0–36.0)
MCV: 90.9 fL (ref 78.0–100.0)
MONO ABS: 1.6 10*3/uL — AB (ref 0.1–1.0)
Monocytes Relative: 18 %
NEUTROS ABS: 3.7 10*3/uL (ref 1.7–7.7)
NEUTROS PCT: 43 %
PLATELETS: 283 10*3/uL (ref 150–400)
RBC: 4.6 MIL/uL (ref 4.22–5.81)
RDW: 12.9 % (ref 11.5–15.5)
WBC: 8.7 10*3/uL (ref 4.0–10.5)

## 2018-02-02 LAB — URINALYSIS, ROUTINE W REFLEX MICROSCOPIC
BACTERIA UA: NONE SEEN
BILIRUBIN URINE: NEGATIVE
Glucose, UA: NEGATIVE mg/dL
HGB URINE DIPSTICK: NEGATIVE
KETONES UR: NEGATIVE mg/dL
NITRITE: NEGATIVE
PROTEIN: 30 mg/dL — AB
Specific Gravity, Urine: 1.025 (ref 1.005–1.030)
Squamous Epithelial / LPF: NONE SEEN
pH: 5 (ref 5.0–8.0)

## 2018-02-02 LAB — LIPASE, BLOOD: Lipase: 32 U/L (ref 11–51)

## 2018-02-02 MED ORDER — ONDANSETRON HCL 4 MG/2ML IJ SOLN
4.0000 mg | Freq: Once | INTRAMUSCULAR | Status: AC
  Administered 2018-02-02: 4 mg via INTRAVENOUS
  Filled 2018-02-02: qty 2

## 2018-02-02 MED ORDER — PROMETHAZINE HCL 25 MG PO TABS: 25.0000 mg | ORAL_TABLET | Freq: Four times a day (QID) | ORAL | 0 refills | Status: DC | PRN

## 2018-02-02 MED ORDER — SODIUM CHLORIDE 0.9 % IV BOLUS (SEPSIS)
1000.0000 mL | Freq: Once | INTRAVENOUS | Status: AC
  Administered 2018-02-02: 1000 mL via INTRAVENOUS

## 2018-02-02 MED ORDER — POTASSIUM CHLORIDE CRYS ER 20 MEQ PO TBCR
40.0000 meq | EXTENDED_RELEASE_TABLET | Freq: Once | ORAL | Status: AC
  Administered 2018-02-02: 40 meq via ORAL
  Filled 2018-02-02: qty 2

## 2018-02-02 NOTE — ED Triage Notes (Addendum)
Pt reports N/V/D for 1.5 weeks. Pt reports he is having difficulty keeping foods or liquids down. Pt reports the day before symptoms began he had questionable seafood. Pt concerned for food poisoning. Pt reports multiple episodes of V/D daily for the last 10 days. Pt endorses epigastric pain

## 2018-02-02 NOTE — ED Provider Notes (Signed)
[facility] Provider Note   CSN: [id number] Arrival date & time: 02/02/18  1609     History   Chief Complaint Chief Complaint  Patient presents with  . Emesis  . Abdominal Pain    HPI Maurice Young is a 27 y.o. male.  HPI   27 year old male with history of asthma presenting for evaluation of nausea vomiting and diarrhea.  Patient report for the past 10 days he has had persistent diarrhea.  Started shortly after he ate some cooked shrimp that taste abnormal.  He then has been having recurrent vomiting at least once or twice in the morning, and persistent diarrhea without blood or mucus throughout the day.  States he may have up to 20 bouts of diarrhea per day.  He reports decrease in appetite, and having mild abdominal pain.  Pain is described as a crampy sensation to his upper abdomen when he eats.  Pain is minimal at this time.  Endorse some mild chills.  Denies any URI symptoms, chest pain, shortness of breath, lightheadedness, dizziness, dysuria, or rash.  Denies any other recent sick contact or recent travel.  He did try Pepto-Bismol and taking probiotic without any relief.  Denies any recent antibiotic use.  Past Medical History:  Diagnosis Date  . Asthma     There are no active problems to display for this patient.   History reviewed. No pertinent surgical history.     Home Medications    Prior to Admission medications   Medication Sig Start Date End Date Taking? Authorizing Provider  albuterol (PROVENTIL HFA;VENTOLIN HFA) 108 (90 Base) MCG/ACT inhaler Inhale 2 puffs into the lungs every 6 (six) hours as needed for wheezing or shortness of breath.    [provider]  amoxicillin (AMOXIL) 500 MG capsule Take 1 capsule (500 mg total) by mouth 2 (two) times daily. Patient not taking: Reported on 01/06/2018 11/02/17   Ward, Chase PicketJaime Pilcher, PA-C  oseltamivir (TAMIFLU) 75 MG capsule Take 1 capsule (75 mg total) by mouth every 12  (twelve) hours. 01/06/18   Pisciotta, Joni ReiningNicole, PA-C  predniSONE (DELTASONE) 10 MG tablet 6,5,4,3,2, 1 taper Patient not taking: Reported on 01/06/2018 12/11/12   Elson AreasSofia, Leslie K, PA-C  traMADol (ULTRAM) 50 MG tablet Take 1 tablet (50 mg total) by mouth every 6 (six) hours as needed. 01/06/18   Pisciotta, Mardella LaymanNicole, PA-C    Family History History reviewed. No pertinent family history.  Social History Social History   Tobacco Use  . Smoking status: Current Every Day Smoker    Packs/day: 0.50    Types: Cigarettes  . Smokeless tobacco: Never Used  Substance Use Topics  . Alcohol use: Yes  . Drug use: No     Allergies   Ibuprofen   Review of Systems Review of Systems  All other systems reviewed and are negative.    Physical Exam Updated Vital Signs BP 122/85 (BP Location: Left Arm)   Pulse 87   Temp 98.4 F (36.9 C) (Oral)   Resp 20   Ht 6\' 2"  (1.88 m)   Wt 108.5 kg (239 lb 1.6 oz)   SpO2 100%   BMI 30.70 kg/m   Physical Exam  Constitutional: He appears well-developed and well-nourished. No distress.  HENT:  Head: Atraumatic.  Mouth/Throat: Oropharynx is clear and moist.  Eyes: Conjunctivae are normal.  Neck: Neck supple.  Cardiovascular: Normal rate and regular rhythm.  Pulmonary/Chest: Effort normal and breath sounds normal.  Abdominal: Soft. Normal appearance and bowel  sounds are normal. There is generalized tenderness. There is no tenderness at McBurney's point and negative Murphy's sign.  Neurological: He is alert.  Skin: No rash noted.  Psychiatric: He has a normal mood and affect.  Nursing note and vitals reviewed.    ED Treatments / Results  Labs (all labs ordered are listed, but only abnormal results are displayed) Labs Reviewed  CBC WITH DIFFERENTIAL/PLATELET - Abnormal; Notable for the following components:      Result Value   Monocytes Absolute 1.6 (*)    All other components within normal limits  COMPREHENSIVE METABOLIC PANEL - Abnormal; Notable  for the following components:   Potassium 3.1 (*)    Calcium 8.5 (*)    All other components within normal limits  URINALYSIS, ROUTINE W REFLEX MICROSCOPIC - Abnormal; Notable for the following components:   Protein, ur 30 (*)    Leukocytes, UA TRACE (*)    All other components within normal limits  GASTROINTESTINAL PANEL BY PCR, STOOL (REPLACES STOOL CULTURE)  LIPASE, BLOOD  PATHOLOGIST SMEAR REVIEW    EKG  EKG Interpretation None       Radiology No results found.  Procedures Procedures (including critical care time)  Medications Ordered in ED Medications  potassium chloride SA (K-DUR,KLOR-CON) CR tablet 40 mEq (not administered)  ondansetron (ZOFRAN) injection 4 mg (4 mg Intravenous Given 02/02/18 2100)  sodium chloride 0.9 % bolus 1,000 mL (0 mLs Intravenous Stopped 02/02/18 2123)     Initial Impression / Assessment and Plan / ED Course  I have reviewed the triage vital signs and the nursing notes.  Pertinent labs & imaging results that were available during my care of the patient were reviewed by me and considered in my medical decision making (see chart for details).     BP 122/85 (BP Location: Left Arm)   Pulse 87   Temp 98 F (36.7 C) (Oral)   Resp 20   Ht 6\' 2"  (1.88 m)   Wt 108.5 kg (239 lb 1.6 oz)   SpO2 100%   BMI 30.70 kg/m    Final Clinical Impressions(s) / ED Diagnoses   Final diagnoses:  Nausea vomiting and diarrhea  Hypokalemia    ED Discharge Orders        Ordered    promethazine (PHENERGAN) 25 MG tablet  Every 6 hours PRN     02/02/18 2224     7:58 PM Patient here with persistent nausea vomiting diarrhea.  Also has some mild abdominal discomfort.  He is well-appearing, does not appears to be dehydrated.  Symptoms suggestive of bilateral GI.  Workup initiated, IV fluid given.  Will try to obtain a stool culture.  10:23 PM Mild hypokalemia with K+ 3.1, supplementation given.  Recommend eating a banana daily x 1 week.  Pt otherwise  stable for discharge.  Return precaution given.  c-diff test have not resulted yet.    Fayrene Helper, PA-C 02/02/18 2225    Wynetta Fines, MD 02/02/18 [phone]

## 2018-02-08 ENCOUNTER — Encounter (HOSPITAL_COMMUNITY): Payer: Self-pay

## 2018-02-08 ENCOUNTER — Emergency Department (HOSPITAL_COMMUNITY)
Admission: EM | Admit: 2018-02-08 | Discharge: 2018-02-08 | Disposition: A | Discharge status: Home / Self Care | Payer: Self-pay | Attending: Emergency Medicine | Admitting: Emergency Medicine

## 2018-02-08 ENCOUNTER — Other Ambulatory Visit: Payer: Self-pay

## 2018-02-08 ENCOUNTER — Emergency Department (HOSPITAL_COMMUNITY): Payer: Self-pay

## 2018-02-08 DIAGNOSIS — R197 Diarrhea, unspecified: Secondary | ICD-10-CM | POA: Insufficient documentation

## 2018-02-08 DIAGNOSIS — R109 Unspecified abdominal pain: Secondary | ICD-10-CM | POA: Insufficient documentation

## 2018-02-08 DIAGNOSIS — E876 Hypokalemia: Secondary | ICD-10-CM

## 2018-02-08 DIAGNOSIS — F17210 Nicotine dependence, cigarettes, uncomplicated: Secondary | ICD-10-CM | POA: Insufficient documentation

## 2018-02-08 DIAGNOSIS — J45909 Unspecified asthma, uncomplicated: Secondary | ICD-10-CM | POA: Insufficient documentation

## 2018-02-08 DIAGNOSIS — R112 Nausea with vomiting, unspecified: Secondary | ICD-10-CM

## 2018-02-08 LAB — I-STAT VENOUS BLOOD GAS, ED
Acid-base deficit: 2 mmol/L (ref 0.0–2.0)
BICARBONATE: 23.5 mmol/L (ref 20.0–28.0)
O2 SAT: 60 %
PCO2 VEN: 42.1 mmHg — AB (ref 44.0–60.0)
PO2 VEN: 33 mmHg (ref 32.0–45.0)
TCO2: 25 mmol/L (ref 22–32)
pH, Ven: 7.355 (ref 7.250–7.430)

## 2018-02-08 LAB — CBC
HEMATOCRIT: 45.3 % (ref 39.0–52.0)
HEMOGLOBIN: 16 g/dL (ref 13.0–17.0)
MCH: 31.9 pg (ref 26.0–34.0)
MCHC: 35.3 g/dL (ref 30.0–36.0)
MCV: 90.4 fL (ref 78.0–100.0)
Platelets: 380 10*3/uL (ref 150–400)
RBC: 5.01 MIL/uL (ref 4.22–5.81)
RDW: 12.6 % (ref 11.5–15.5)
WBC: 10.4 10*3/uL (ref 4.0–10.5)

## 2018-02-08 LAB — URINALYSIS, ROUTINE W REFLEX MICROSCOPIC
BILIRUBIN URINE: NEGATIVE
GLUCOSE, UA: NEGATIVE mg/dL
HGB URINE DIPSTICK: NEGATIVE
KETONES UR: NEGATIVE mg/dL
Leukocytes, UA: NEGATIVE
Nitrite: NEGATIVE
PH: 6 (ref 5.0–8.0)
Protein, ur: NEGATIVE mg/dL
SPECIFIC GRAVITY, URINE: 1.013 (ref 1.005–1.030)

## 2018-02-08 LAB — COMPREHENSIVE METABOLIC PANEL
ALK PHOS: 64 U/L (ref 38–126)
ALT: 46 U/L (ref 17–63)
ANION GAP: 15 (ref 5–15)
AST: 36 U/L (ref 15–41)
Albumin: 3.8 g/dL (ref 3.5–5.0)
BILIRUBIN TOTAL: 0.8 mg/dL (ref 0.3–1.2)
BUN: 5 mg/dL — AB (ref 6–20)
CO2: 16 mmol/L — AB (ref 22–32)
Calcium: 9 mg/dL (ref 8.9–10.3)
Chloride: 107 mmol/L (ref 101–111)
Creatinine, Ser: 0.9 mg/dL (ref 0.61–1.24)
GFR calc Af Amer: >60 mL/min (ref >60)
GFR calc non Af Amer: >60 mL/min (ref >60)
GLUCOSE: 105 mg/dL — AB (ref 65–99)
POTASSIUM: 3 mmol/L — AB (ref 3.5–5.1)
SODIUM: 138 mmol/L (ref 135–145)
Total Protein: 7 g/dL (ref 6.5–8.1)

## 2018-02-08 LAB — LIPASE, BLOOD: Lipase: 30 U/L (ref 11–51)

## 2018-02-08 LAB — I-STAT CG4 LACTIC ACID, ED: Lactic Acid, Venous: 1.37 mmol/L (ref 0.5–1.9)

## 2018-02-08 MED ORDER — METRONIDAZOLE 500 MG PO TABS: 500.0000 mg | ORAL_TABLET | Freq: Three times a day (TID) | ORAL | 0 refills | Status: AC

## 2018-02-08 MED ORDER — METOCLOPRAMIDE HCL 5 MG/ML IJ SOLN
10.0000 mg | Freq: Once | INTRAMUSCULAR | Status: AC
  Administered 2018-02-08: 10 mg via INTRAVENOUS
  Filled 2018-02-08: qty 2

## 2018-02-08 MED ORDER — IOPAMIDOL (ISOVUE-300) INJECTION 61%
INTRAVENOUS | Status: AC
Start: 2018-02-08 — End: 2018-02-08
  Administered 2018-02-08: 100 mL
  Filled 2018-02-08: qty 100

## 2018-02-08 MED ORDER — SODIUM CHLORIDE 0.9 % IV BOLUS (SEPSIS)
1000.0000 mL | Freq: Once | INTRAVENOUS | Status: AC
  Administered 2018-02-08: 1000 mL via INTRAVENOUS

## 2018-02-08 MED ORDER — CIPROFLOXACIN HCL 500 MG PO TABS: 500.0000 mg | ORAL_TABLET | Freq: Two times a day (BID) | ORAL | 0 refills | Status: AC

## 2018-02-08 MED ORDER — METOCLOPRAMIDE HCL 10 MG PO TABS: 10.0000 mg | ORAL_TABLET | Freq: Four times a day (QID) | ORAL | 0 refills | Status: DC | PRN

## 2018-02-08 MED ORDER — POTASSIUM CHLORIDE CRYS ER 20 MEQ PO TBCR
20.0000 meq | EXTENDED_RELEASE_TABLET | Freq: Once | ORAL | Status: AC
  Administered 2018-02-08: 20 meq via ORAL
  Filled 2018-02-08: qty 1

## 2018-02-08 MED ORDER — POTASSIUM CHLORIDE ER 10 MEQ PO TBCR: 10.0000 meq | EXTENDED_RELEASE_TABLET | Freq: Every day | ORAL | 0 refills | Status: DC

## 2018-02-08 NOTE — ED Triage Notes (Addendum)
Pt presents to the ed for complaints of upper abdominal pain, vomiting and diarrhea x 3 weeks. States he has been to the hospital for it before but not for the pain. Reports losing 30 pounds in 3 weeks on accident

## 2018-02-08 NOTE — ED Provider Notes (Signed)
[facility]   CSN: [id number] Arrival date & time: 02/08/18  1612     History   Chief Complaint Chief Complaint  Patient presents with  . Abdominal Pain  . Emesis    HPI Maurice Young is a 27 y.o. male presenting for evaluation of N/V/D and abd pain.   Pt states for the past 3 weeks, he has had persistent N/V/D. Vomiting is post-prandial. This was unrelieved by phenergan. He is able to tolerate liquids without difficulty. He reports 8-10 BMs a day. They are non bloody. Over the past few days, he has had worsening abd cramping, described as gas pain. It is intermittent, worse with eating and BMs. Pt states he has lost >30 lbs since this began. He denies fevers, chills, CP, SOB, or unary sxs. He denies history of similar, no h/o abd problems. He states this started after he ate "bad seafood." No one else is sick. No recent travel. Last abx use was in November 2018.   HPI  Past Medical History:  Diagnosis Date  . Asthma     There are no active problems to display for this patient.   History reviewed. No pertinent surgical history.     Home Medications    Prior to Admission medications   Medication Sig Start Date End Date Taking? Authorizing Provider  albuterol (PROVENTIL HFA;VENTOLIN HFA) 108 (90 Base) MCG/ACT inhaler Inhale 2 puffs into the lungs every 6 (six) hours as needed for wheezing or shortness of breath.    [provider]  ciprofloxacin (CIPRO) 500 MG tablet Take 1 tablet (500 mg total) by mouth every 12 (twelve) hours for 10 days. 02/08/18 02/18/18  Orry Sigl, PA-C  metoCLOPramide (REGLAN) 10 MG tablet Take 1 tablet (10 mg total) by mouth every 6 (six) hours as needed for nausea or vomiting. 02/08/18   Alverna Fawley, PA-C  metroNIDAZOLE (FLAGYL) 500 MG tablet Take 1 tablet (500 mg total) by mouth 3 (three) times daily for 10 days. 02/08/18 02/18/18  Vincenzo Stave, PA-C  potassium chloride (K-DUR)  10 MEQ tablet Take 1 tablet (10 mEq total) by mouth daily. 02/08/18   Kollen Armenti, PA-C  promethazine (PHENERGAN) 25 MG tablet Take 1 tablet (25 mg total) by mouth every 6 (six) hours as needed for nausea. 02/02/18   Fayrene Helperran, [place], PA-C    Family History No family history on file.  Social History Social History   Tobacco Use  . Smoking status: Current Every Day Smoker    Packs/day: 0.50    Types: Cigarettes  . Smokeless tobacco: Never Used  Substance Use Topics  . Alcohol use: Yes  . Drug use: No     Allergies   Ibuprofen   Review of Systems Review of Systems  Gastrointestinal: Positive for abdominal pain, diarrhea, nausea and vomiting.  All other systems reviewed and are negative.    Physical Exam Updated Vital Signs BP 118/79   Pulse 81   Temp 98.2 F (36.8 C) (Oral)   Resp 20   SpO2 98%   Physical Exam  Constitutional: He is oriented to person, place, and time. He appears well-developed and well-nourished. No distress.  HENT:  Head: Normocephalic and atraumatic.  Mouth/Throat: Mucous membranes are not dry.  Eyes: Conjunctivae and EOM are normal. Pupils are equal, round, and reactive to light.  Neck: Normal range of motion. Neck supple.  Cardiovascular: Normal rate, regular rhythm and intact distal pulses.  Pulmonary/Chest: Effort normal and breath sounds normal.  No respiratory distress. He has no wheezes.  Abdominal: Soft. Bowel sounds are normal. He exhibits no distension and no mass. There is no tenderness. There is no guarding.  No TTP of the abd. Abd is soft, without rigidity or guarding. BS normoactive x4.   Musculoskeletal: Normal range of motion.  Neurological: He is alert and oriented to person, place, and time.  Skin: Skin is warm and dry.  Psychiatric: He has a normal mood and affect.  Nursing note and vitals reviewed.    ED Treatments / Results  Labs (all labs ordered are listed, but only abnormal results are displayed) Labs Reviewed    COMPREHENSIVE METABOLIC PANEL - Abnormal; Notable for the following components:      Result Value   Potassium 3.0 (*)    CO2 16 (*)    Glucose, Bld 105 (*)    BUN 5 (*)    All other components within normal limits  URINALYSIS, ROUTINE W REFLEX MICROSCOPIC - Abnormal; Notable for the following components:   APPearance HAZY (*)    All other components within normal limits  I-STAT VENOUS BLOOD GAS, ED - Abnormal; Notable for the following components:   pCO2, Ven 42.1 (*)    All other components within normal limits  C DIFFICILE QUICK SCREEN W PCR REFLEX  GASTROINTESTINAL PANEL BY PCR, STOOL (REPLACES STOOL CULTURE)  LIPASE, BLOOD  CBC  I-STAT CG4 LACTIC ACID, ED    EKG  EKG Interpretation None       Radiology Ct Abdomen Pelvis W Contrast  Result Date: 02/08/2018 CLINICAL DATA:  Abdominal pain with vomiting and diarrhea EXAM: CT ABDOMEN AND PELVIS WITH CONTRAST TECHNIQUE: Multidetector CT imaging of the abdomen and pelvis was performed using the standard protocol following bolus administration of intravenous contrast. CONTRAST:  ISOVUE-300 IOPAMIDOL (ISOVUE-300) INJECTION 61% COMPARISON:  None. FINDINGS: Lower chest: Lung bases are clear. Hepatobiliary: No focal liver lesions are apparent. Gallbladder wall is not appreciably thickened. There is no biliary duct dilatation. Pancreas: No pancreatic mass or inflammatory focus. Spleen: No splenic lesions are evident. Adrenals/Urinary Tract: Adrenals bilaterally appear normal. Kidneys bilaterally show no evident mass or hydronephrosis on either side. There is no renal or ureteral calculus on either side. Urinary bladder is midline with wall thickness within normal limits. Stomach/Bowel: Stomach is mildly distended with food material. There is mild wall thickening involving multiple loops of bowel. No bowel obstruction. No free air or portal venous air. Vascular/Lymphatic: No abdominal aortic aneurysm. No vascular lesions are evident. No  adenopathy evident in the abdomen or pelvis. Reproductive: Prostate and seminal vesicles are normal in size and contour. No pelvic mass evident. Other: Appendix appears normal. No abscess or ascites evident in the abdomen or pelvis. There is a small ventral hernia containing only fat. Musculoskeletal: There are no blastic or lytic bone lesions. There is no intramuscular or abdominal wall lesions. IMPRESSION: 1. Several loops of bowel have mildly thickened walls. Suspect a degree of enteritis. No bowel obstruction. Note that stomach is mildly distended with food material. Question a degree of underlying ileus. 2.  No abscess.  Appendix appears normal. 3.  No renal or ureteral calculus.  No hydronephrosis. 4.  Small ventral hernia containing only fat. Electronically Signed   By: Bretta Bang III M.D.   On: 02/08/2018 19:23    Procedures Procedures (including critical care time)  Medications Ordered in ED Medications  iopamidol (ISOVUE-300) 61 % injection (100 mLs  Contrast Given 02/08/18 1907)  potassium chloride SA (  K-DUR,KLOR-CON) CR tablet 20 mEq (20 mEq Oral Given 02/08/18 2023)  sodium chloride 0.9 % bolus 1,000 mL (1,000 mLs Intravenous New Bag/Given 02/08/18 2023)  metoCLOPramide (REGLAN) injection 10 mg (10 mg Intravenous Given 02/08/18 2023)     Initial Impression / Assessment and Plan / ED Course  I have reviewed the triage vital signs and the nursing notes.  Pertinent labs & imaging results that were available during my care of the patient were reviewed by me and considered in my medical decision making (see chart for details).     Pt presenting for evaluation of N/V/D and abd pain. This has been present for 3 weeks. Physical exam reassuring, pt does not look dry and appears nontoxic.  Patient is not tachycardic, blood pressure stable.  He is afebrile.  Abdominal exam reassuring, no tenderness to palpation.  Abdomen is soft without rigidity or guarding.  Lab work shows no leukocytosis.   Patient slightly hypokalemic.  Bicarb low at 16.  Creatinine stable.  As patient has weight loss and 3 weeks of symptoms, will obtain CT scan.  GI panel and C. difficile sent.  CT scan shows enteritis and a stomach distention.  No obstruction or perforation.  Discussed case with attending, Dr. Madilyn Hook reviewed the CT and labs.  She agrees to plan.  Will obtain VBG, lactate, start fluids and give potassium.  Reglan given p.o. challenge attempted.  Will consult with GI for follow-up and further management.  Lactate normal at 1.37.  VBG reassuring.  Discussed with Dr. Adela Lank from [facility]. Recommends f/u in clinic next week and starting on cipro and flagyl. Pt tolerating crackers ok with reglan. Will d/c with abx, reglan, and potassium. Discussed importance of f/u. At this time, pt appears safe for discharge. Return precautions given. Pt states he understands and agrees to plan.    Final Clinical Impressions(s) / ED Diagnoses   Final diagnoses:  Nausea vomiting and diarrhea  Hypokalemia    ED Discharge Orders        Ordered    ciprofloxacin (CIPRO) 500 MG tablet  Every 12 hours     02/08/18 2100    metroNIDAZOLE (FLAGYL) 500 MG tablet  3 times daily     02/08/18 2100    potassium chloride (K-DUR) 10 MEQ tablet  Daily     02/08/18 2100    metoCLOPramide (REGLAN) 10 MG tablet  Every 6 hours PRN     02/08/18 2100       Alveria Apley, PA-C 02/08/18 2111    Tilden Fossa, MD 02/09/18 [phone]

## 2018-02-08 NOTE — ED Notes (Signed)
Pt understood dc material. NAD noted. Scripts given at [facility]. Follow up discussed

## 2018-02-08 NOTE — ED Notes (Signed)
Pt understood dc material. NAD noted. Scripts given at dc 

## 2018-02-08 NOTE — Discharge Instructions (Signed)
Take antibiotics as prescribed.  Take the entire course, even if your symptoms improve. Use Reglan as needed for nausea or vomiting. Take potassium pills once a day for the next week. Make sure you stay well-hydrated with water. Eat small bland meals over the next several days. Follow-up with the GI doctor for further evaluation next week. Return to the emergency room if you develop persistent high fevers, severe abdominal pain, or any new or worsening symptoms.

## 2018-02-09 LAB — GASTROINTESTINAL PANEL BY PCR, STOOL (REPLACES STOOL CULTURE)
Adenovirus F40/41: NOT DETECTED
Astrovirus: NOT DETECTED
CRYPTOSPORIDIUM: NOT DETECTED
Campylobacter species: NOT DETECTED
Cyclospora cayetanensis: NOT DETECTED
ENTEROAGGREGATIVE E COLI (EAEC): NOT DETECTED
Entamoeba histolytica: NOT DETECTED
Enteropathogenic E coli (EPEC): NOT DETECTED
Enterotoxigenic E coli (ETEC): NOT DETECTED
GIARDIA LAMBLIA: DETECTED — AB
NOROVIRUS GI/GII: NOT DETECTED
Plesimonas shigelloides: NOT DETECTED
ROTAVIRUS A: NOT DETECTED
SALMONELLA SPECIES: NOT DETECTED
SAPOVIRUS (I, II, IV, AND V): NOT DETECTED
SHIGA LIKE TOXIN PRODUCING E COLI (STEC): NOT DETECTED
SHIGELLA/ENTEROINVASIVE E COLI (EIEC): NOT DETECTED
Vibrio cholerae: NOT DETECTED
Vibrio species: NOT DETECTED
YERSINIA ENTEROCOLITICA: NOT DETECTED

## 2018-02-09 LAB — C DIFFICILE QUICK SCREEN W PCR REFLEX
C DIFFICILE (CDIFF) TOXIN: NEGATIVE
C Diff antigen: NEGATIVE
C Diff interpretation: NOT DETECTED

## 2018-02-10 ENCOUNTER — Encounter: Payer: Self-pay | Admitting: Physician Assistant

## 2018-02-19 ENCOUNTER — Ambulatory Visit: Payer: Self-pay | Admitting: Physician Assistant

## 2018-07-14 ENCOUNTER — Encounter (HOSPITAL_COMMUNITY): Payer: Self-pay | Admitting: Emergency Medicine

## 2018-07-14 ENCOUNTER — Emergency Department (HOSPITAL_COMMUNITY): Payer: Self-pay

## 2018-07-14 ENCOUNTER — Emergency Department (HOSPITAL_COMMUNITY)
Admission: EM | Admit: 2018-07-14 | Discharge: 2018-07-14 | Disposition: A | Discharge status: Home / Self Care | Payer: Self-pay | Attending: Emergency Medicine | Admitting: Emergency Medicine

## 2018-07-14 DIAGNOSIS — Z79899 Other long term (current) drug therapy: Secondary | ICD-10-CM | POA: Insufficient documentation

## 2018-07-14 DIAGNOSIS — Y9289 Other specified places as the place of occurrence of the external cause: Secondary | ICD-10-CM | POA: Insufficient documentation

## 2018-07-14 DIAGNOSIS — Y998 Other external cause status: Secondary | ICD-10-CM | POA: Insufficient documentation

## 2018-07-14 DIAGNOSIS — X509XXA Other and unspecified overexertion or strenuous movements or postures, initial encounter: Secondary | ICD-10-CM | POA: Insufficient documentation

## 2018-07-14 DIAGNOSIS — J45909 Unspecified asthma, uncomplicated: Secondary | ICD-10-CM | POA: Insufficient documentation

## 2018-07-14 DIAGNOSIS — S8391XA Sprain of unspecified site of right knee, initial encounter: Secondary | ICD-10-CM | POA: Insufficient documentation

## 2018-07-14 DIAGNOSIS — M25561 Pain in right knee: Secondary | ICD-10-CM

## 2018-07-14 DIAGNOSIS — F17210 Nicotine dependence, cigarettes, uncomplicated: Secondary | ICD-10-CM | POA: Insufficient documentation

## 2018-07-14 DIAGNOSIS — Y9341 Activity, dancing: Secondary | ICD-10-CM | POA: Insufficient documentation

## 2018-07-14 NOTE — Discharge Instructions (Signed)
Wear knee immobilizer for at least 2 weeks for stabilization of knee, you may take it off to shower and sleep. Use crutches as needed for comfort. Ice and elevate knee throughout the day, using ice pack for no more than 20 minutes every hour. Alternate between tylenol and ibuprofen as needed for pain relief. Follow up with the orthopedist in 5-7 days for recheck of symptoms and ongoing management of your injury and knee pain. Return to the ER for changes or worsening symptoms.

## 2018-07-14 NOTE — ED Triage Notes (Addendum)
Patient reports "[place] the club." Patient reports continued swelling and pain to right knee and leg. Reports pain worsens with movement. Denies numbness and tingling.

## 2018-07-14 NOTE — ED Notes (Signed)
Ice pack given after x ray

## 2018-07-14 NOTE — ED Provider Notes (Signed)
[facility]-EMERGENCY DEPT Provider Note   CSN: [id number] Arrival date & time: 07/14/18  1608     History   Chief Complaint Chief Complaint  Patient presents with  . Knee Pain    HPI Maurice Young is a 27 y.o. male with a PMHx of asthma, who presents to the ED with complaints of right knee pain for the last 4 days.  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  Four days ago he was on the dance floor and it happened again, causing him to fall and landing on his right knee.  He did not hit his head or lose consciousness.  Since then he has had right knee pain which he describes a 7/10 constant sharp right knee pain radiating into the calf and thigh, worse with walking and after prolonged sitting or immobilization, and somewhat improved with Flexeril, heat, and ice use.  He reports associated swelling and bruising to the area.  He has never seen an orthopedist before.  He denies any erythema or warmth to the area, abrasions or wounds, head injury/LOC, numbness, tingling, focal weakness, other injury sustained during the incident, or any other complaints at this time.  The history is provided by the patient and medical records. No language interpreter was used.  Knee Pain   Pertinent negatives include no numbness.    Past Medical History:  Diagnosis Date  . Asthma     There are no active problems to display for this patient.   History reviewed. No pertinent surgical history.      Home Medications    Prior to Admission medications   Medication Sig Start Date End Date Taking? Authorizing Provider  albuterol (PROVENTIL HFA;VENTOLIN HFA) 108 (90 Base) MCG/ACT inhaler Inhale 2 puffs into the lungs every 6 (six) hours as needed for wheezing or shortness of breath.    [provider]  metoCLOPramide (REGLAN) 10 MG tablet Take 1 tablet (10 mg total) by mouth every  6 (six) hours as needed for nausea or vomiting. 02/08/18   Caccavale, Sophia, PA-C  potassium chloride (K-DUR) 10 MEQ tablet Take 1 tablet (10 mEq total) by mouth daily. 02/08/18   Caccavale, Sophia, PA-C  promethazine (PHENERGAN) 25 MG tablet Take 1 tablet (25 mg total) by mouth every 6 (six) hours as needed for nausea. 02/02/18   Fayrene Helperran, Bowie, PA-C    Family History No family history on file.  Social History Social History   Tobacco Use  . Smoking status: Current Every Day Smoker    Packs/day: 0.50    Types: Cigarettes  . Smokeless tobacco: Never Used  Substance Use Topics  . Alcohol use: Yes    Alcohol/week: 1.2 oz    Types: 2 Cans of beer per week  . Drug use: No     Allergies   Ibuprofen   Review of Systems Review of Systems  HENT: Negative for facial swelling (no head inj).   Musculoskeletal: Positive for arthralgias and joint swelling.  Skin: Positive for color change. Negative for wound.  Allergic/Immunologic: Negative for immunocompromised state.  Neurological: Negative for syncope, weakness and numbness.  Psychiatric/Behavioral: Negative for confusion.     Physical Exam Updated Vital Signs BP 116/72 (BP Location: Right Arm)   Pulse 75   Temp 98.4 F (36.9 C) (Oral)   Resp 18   SpO2 98%   Physical Exam  Constitutional: He is oriented to person,  place, and time. Vital signs are normal. He appears well-developed and well-nourished.  Non-toxic appearance. No distress.  Afebrile, nontoxic, NAD  HENT:  Head: Normocephalic and atraumatic.  Mouth/Throat: Mucous membranes are normal.  Eyes: Conjunctivae and EOM are normal. Right eye exhibits no discharge. Left eye exhibits no discharge.  Neck: Normal range of motion. Neck supple.  Cardiovascular: Normal rate and intact distal pulses.  Pulmonary/Chest: Effort normal. No respiratory distress.  Abdominal: Normal appearance. He exhibits no distension.  Musculoskeletal:       Right knee: He exhibits decreased range of  motion (due to pain) and swelling. He exhibits no effusion, no ecchymosis, no deformity, no laceration, no erythema, normal alignment, no LCL laxity, normal patellar mobility and no MCL laxity. Tenderness found. Lateral joint line and patellar tendon tenderness noted.  R knee with limited ROM due to pain, mostly with extension, only able to extend ~50%, full flexion intact, with mild lateral joint line TTP, and moderate TTP over patellar tendon, no defect palpable to patellar tendon so this appears to be intact, no tenderness to quads tendon and this also appears to be intact, minimal swelling anteromedially, no effusion, no crepitus or deformity, no bruising or erythema, no warmth, no abnormal alignment or patellar mobility, no varus/valgus laxity, neg anterior drawer test.  Distal strength and sensation grossly intact, distal pulses intact, compartments soft   Neurological: He is alert and oriented to person, place, and time. He has normal strength. No sensory deficit.  Skin: Skin is warm, dry and intact. No rash noted.  Psychiatric: He has a normal mood and affect.  Nursing note and vitals reviewed.    ED Treatments / Results  Labs (all labs ordered are listed, but only abnormal results are displayed) Labs Reviewed - No data to display  EKG None  Radiology Dg Knee Complete 4 Views Right  Result Date: 07/14/2018 CLINICAL DATA:  Right lateral knee pain and pain inferior and superior to the patella. Patient fell while dancing 4 days ago. EXAM: RIGHT KNEE - COMPLETE 4+ VIEW COMPARISON:  04/09/2007 FINDINGS: No evidence of acute fracture, dislocation, or joint effusion. Degenerative spurring is noted of the lateral tibial spine, mesial aspect of the femoral condyles with minimal stable bony protuberance along the lateral aspect of the tibial epiphysis. No aggressive osseous lesions. Mild anterior soft tissue swelling is suggested 1 of the oblique views along the anteromedial aspect knee.  IMPRESSION: 1. Mild soft tissue swelling/contusion along the anteromedial right knee. 2. No acute fracture, effusion nor joint dislocation. Electronically Signed   By: Tollie Eth M.D.   On: 07/14/2018 18:13    Procedures Procedures (including critical care time)  Medications Ordered in ED Medications - No data to display   Initial Impression / Assessment and Plan / ED Course  I have reviewed the triage vital signs and the nursing notes.  Pertinent labs & imaging results that were available during my care of the patient were reviewed by me and considered in my medical decision making (see chart for details).     27 y.o. male here with R knee pain x4 days. 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 Has had pain since then. On exam, mildly limited extension ROM due to pain, able to extend about 50%, mild swelling anteromedially, moderately TTP over patellar tendon and laterally, quads tendon feels intact, patellar tendon without defect so seems to be grossly intact, NVI with soft  compartments. Xray showing mild soft tissue swelling/contusion anteromedially but otherwise no other findings. I suspect partial tear of patellar tendon at least potentially some tearing from the inferior margin of the patella, potentially from his recurrent patellar dislocations. Will place in immobilizer and treat as sprain, will give crutches, advised RICE/tylenol/motrin for pain, and f/up with ortho in 5-7 days for recheck and ongoing management of knee issue. I explained the diagnosis and have given explicit precautions to return to the ER including for any other new or worsening symptoms. The patient understands and accepts the medical plan as it's been dictated and I have answered their questions. Discharge instructions concerning home care and prescriptions have been given. The patient is STABLE and is discharged to home in good condition.    Final  Clinical Impressions(s) / ED Diagnoses   Final diagnoses:  Acute pain of right knee  Sprain of right knee, unspecified ligament, initial encounter    ED Discharge Orders    [address] [place], [place] 07/14/18 1858    Tegeler, Canary Brim, MD 07/15/18 [phone]

## 2018-10-16 ENCOUNTER — Ambulatory Visit: Payer: Self-pay

## 2018-10-16 ENCOUNTER — Other Ambulatory Visit: Payer: Self-pay

## 2018-10-16 DIAGNOSIS — Z113 Encounter for screening for infections with a predominantly sexual mode of transmission: Secondary | ICD-10-CM

## 2018-10-16 DIAGNOSIS — Z79899 Other long term (current) drug therapy: Secondary | ICD-10-CM

## 2018-10-16 DIAGNOSIS — B20 Human immunodeficiency virus [HIV] disease: Secondary | ICD-10-CM

## 2018-10-30 ENCOUNTER — Other Ambulatory Visit: Payer: Self-pay

## 2018-10-30 ENCOUNTER — Encounter: Payer: Self-pay | Admitting: Family

## 2018-10-30 ENCOUNTER — Ambulatory Visit: Payer: Self-pay

## 2018-10-30 DIAGNOSIS — Z113 Encounter for screening for infections with a predominantly sexual mode of transmission: Secondary | ICD-10-CM

## 2018-10-30 DIAGNOSIS — B20 Human immunodeficiency virus [HIV] disease: Secondary | ICD-10-CM

## 2018-10-30 DIAGNOSIS — Z79899 Other long term (current) drug therapy: Secondary | ICD-10-CM

## 2018-10-31 LAB — T-HELPER CELL (CD4) - (RCID CLINIC ONLY)
CD4 T CELL HELPER: 26 % — AB (ref 33–55)
CD4 T Cell Abs: 570 /uL (ref 400–2700)

## 2018-10-31 LAB — URINE CYTOLOGY ANCILLARY ONLY
CHLAMYDIA, DNA PROBE: NEGATIVE
Neisseria Gonorrhea: NEGATIVE

## 2018-11-14 ENCOUNTER — Ambulatory Visit: Payer: Self-pay

## 2018-11-14 ENCOUNTER — Ambulatory Visit: Payer: Self-pay | Admitting: Infectious Diseases

## 2018-11-17 LAB — HEPATITIS B SURFACE ANTIGEN: Hepatitis B Surface Ag: NONREACTIVE

## 2018-11-17 LAB — HEPATITIS C ANTIBODY
Hepatitis C Ab: NONREACTIVE
SIGNAL TO CUT-OFF: 0.03

## 2018-11-17 LAB — URINALYSIS
Bilirubin Urine: NEGATIVE
Glucose, UA: NEGATIVE
Hgb urine dipstick: NEGATIVE
Ketones, ur: NEGATIVE
LEUKOCYTES UA: NEGATIVE
NITRITE: NEGATIVE
PH: 6.5 (ref 5.0–8.0)
Protein, ur: NEGATIVE
SPECIFIC GRAVITY, URINE: 1.02 (ref 1.001–1.03)

## 2018-11-17 LAB — CBC WITH DIFFERENTIAL/PLATELET
BASOS ABS: 18 {cells}/uL (ref 0–200)
Basophils Relative: 0.3 %
EOS ABS: 108 {cells}/uL (ref 15–500)
Eosinophils Relative: 1.8 %
HCT: 41.3 % (ref 38.5–50.0)
HEMOGLOBIN: 14.5 g/dL (ref 13.2–17.1)
Lymphs Abs: 2382 cells/uL (ref 850–3900)
MCH: 31.5 pg (ref 27.0–33.0)
MCHC: 35.1 g/dL (ref 32.0–36.0)
MCV: 89.6 fL (ref 80.0–100.0)
MPV: 9.6 fL (ref 7.5–12.5)
Monocytes Relative: 10.4 %
NEUTROS ABS: 2868 {cells}/uL (ref 1500–7800)
Neutrophils Relative %: 47.8 %
Platelets: 271 10*3/uL (ref 140–400)
RBC: 4.61 10*6/uL (ref 4.20–5.80)
RDW: 11.5 % (ref 11.0–15.0)
Total Lymphocyte: 39.7 %
WBC: 6 10*3/uL (ref 3.8–10.8)
WBCMIX: 624 {cells}/uL (ref 200–950)

## 2018-11-17 LAB — COMPLETE METABOLIC PANEL WITH GFR
AG Ratio: 1.4 (calc) (ref 1.0–2.5)
ALT: 12 U/L (ref 9–46)
AST: 19 U/L (ref 10–40)
Albumin: 4.3 g/dL (ref 3.6–5.1)
Alkaline phosphatase (APISO): 82 U/L (ref 40–115)
BUN: 15 mg/dL (ref 7–25)
CALCIUM: 9.2 mg/dL (ref 8.6–10.3)
CO2: 29 mmol/L (ref 20–32)
CREATININE: 1.07 mg/dL (ref 0.60–1.35)
Chloride: 104 mmol/L (ref 98–110)
GFR, EST AFRICAN AMERICAN: 110 mL/min/{1.73_m2} (ref >=60)
GFR, EST NON AFRICAN AMERICAN: 95 mL/min/{1.73_m2} (ref >=60)
GLOBULIN: 3 g/dL (ref 1.9–3.7)
Glucose, Bld: 93 mg/dL (ref 65–99)
Potassium: 3.8 mmol/L (ref 3.5–5.3)
SODIUM: 140 mmol/L (ref 135–146)
TOTAL PROTEIN: 7.3 g/dL (ref 6.1–8.1)
Total Bilirubin: 0.6 mg/dL (ref 0.2–1.2)

## 2018-11-17 LAB — LIPID PANEL
Cholesterol: 129 mg/dL
HDL: 36 mg/dL — ABNORMAL LOW
LDL Cholesterol (Calc): 61 mg/dL
Non-HDL Cholesterol (Calc): 93 mg/dL
Total CHOL/HDL Ratio: 3.6 (calc)
Triglycerides: 271 mg/dL — ABNORMAL HIGH

## 2018-11-17 LAB — QUANTIFERON-TB GOLD PLUS
Mitogen-NIL: >10 IU/mL
NIL: 0.02 IU/mL
QUANTIFERON-TB GOLD PLUS: NEGATIVE
TB1-NIL: 0 [IU]/mL
TB2-NIL: 0 IU/mL

## 2018-11-17 LAB — HIV ANTIBODY (ROUTINE TESTING W REFLEX): HIV: REACTIVE — AB

## 2018-11-17 LAB — HIV-1 RNA ULTRAQUANT REFLEX TO GENTYP+
HIV 1 RNA Quant: 28000 copies/mL — ABNORMAL HIGH
HIV-1 RNA Quant, Log: 4.45 Log copies/mL — ABNORMAL HIGH

## 2018-11-17 LAB — HEPATITIS B SURFACE ANTIBODY,QUALITATIVE: Hep B S Ab: NONREACTIVE

## 2018-11-17 LAB — HLA B*5701: HLA-B 5701 W/RFLX HLA-B HIGH: NEGATIVE

## 2018-11-17 LAB — RPR: RPR Ser Ql: NONREACTIVE

## 2018-11-17 LAB — HIV-1 GENOTYPE: HIV-1 Genotype: DETECTED — AB

## 2018-11-17 LAB — HEPATITIS A ANTIBODY, TOTAL: Hepatitis A AB,Total: NONREACTIVE

## 2018-11-17 LAB — HIV-1/2 AB - DIFFERENTIATION
HIV 1 ANTIBODY: POSITIVE — AB
HIV-2 Ab: NEGATIVE

## 2018-11-17 LAB — HEPATITIS B CORE ANTIBODY, TOTAL: Hep B Core Total Ab: NONREACTIVE

## 2018-12-08 ENCOUNTER — Encounter: Payer: Self-pay | Admitting: Infectious Diseases

## 2018-12-08 DIAGNOSIS — B20 Human immunodeficiency virus [HIV] disease: Secondary | ICD-10-CM | POA: Insufficient documentation

## 2018-12-08 DIAGNOSIS — Z21 Asymptomatic human immunodeficiency virus [HIV] infection status: Secondary | ICD-10-CM

## 2018-12-08 HISTORY — DX: Human immunodeficiency virus (HIV) disease: B20

## 2018-12-08 HISTORY — DX: Asymptomatic human immunodeficiency virus (hiv) infection status: Z21

## 2018-12-09 ENCOUNTER — Encounter: Payer: Self-pay | Admitting: Infectious Diseases

## 2018-12-09 ENCOUNTER — Ambulatory Visit (INDEPENDENT_AMBULATORY_CARE_PROVIDER_SITE_OTHER): Payer: Self-pay | Admitting: Licensed Clinical Social Worker

## 2018-12-09 ENCOUNTER — Ambulatory Visit (INDEPENDENT_AMBULATORY_CARE_PROVIDER_SITE_OTHER): Payer: Self-pay | Admitting: Pharmacist

## 2018-12-09 ENCOUNTER — Ambulatory Visit (INDEPENDENT_AMBULATORY_CARE_PROVIDER_SITE_OTHER): Payer: Self-pay | Admitting: Infectious Diseases

## 2018-12-09 VITALS — BP 116/75 | HR 97 | Temp 98.0°F | Ht 74.0 in | Wt 250.0 lb

## 2018-12-09 DIAGNOSIS — Z0000 Encounter for general adult medical examination without abnormal findings: Secondary | ICD-10-CM

## 2018-12-09 DIAGNOSIS — Z23 Encounter for immunization: Secondary | ICD-10-CM

## 2018-12-09 DIAGNOSIS — F4329 Adjustment disorder with other symptoms: Secondary | ICD-10-CM

## 2018-12-09 DIAGNOSIS — B20 Human immunodeficiency virus [HIV] disease: Secondary | ICD-10-CM

## 2018-12-09 DIAGNOSIS — F4323 Adjustment disorder with mixed anxiety and depressed mood: Secondary | ICD-10-CM

## 2018-12-09 DIAGNOSIS — Z21 Asymptomatic human immunodeficiency virus [HIV] infection status: Secondary | ICD-10-CM

## 2018-12-09 MED ORDER — ESCITALOPRAM OXALATE 10 MG PO TABS: 10.0000 mg | ORAL_TABLET | Freq: Every day | ORAL | 3 refills | Status: DC

## 2018-12-09 MED ORDER — BICTEGRAVIR-EMTRICITAB-TENOFOV 50-200-25 MG PO TABS: 1.0000 | ORAL_TABLET | Freq: Every day | ORAL | 11 refills | Status: DC

## 2018-12-09 MED FILL — BIKTARVY 50-200-25 MG TABS: 50-200-25 | 30 days supply | Qty: 30 | Fill #0

## 2018-12-09 NOTE — BH Specialist Note (Signed)
Integrated Behavioral Health Initial Visit  MRN: [id number] Name: Maurice Young  Number of Integrated Behavioral Health Clinician visits:: 1/6 Session Start time: 10:53am  Session End time: 11:14am Total time: 20 minutes  Type of Service: Integrated Behavioral Health- Individual/Family Interpretor:No. Interpretor Name and Language: n/a   Warm Hand Off Completed.       SUBJECTIVE: Maurice MantisDakeem Palomo is a 27 y.o. male accompanied by self Patient was referred by Rexene AlbertsStephanie Dixon for adjustment to diagnosis. Patient reports the following symptoms/concerns: sadness, anxiety, crying spells, uncertainty about future and about self concept Duration of problem: 2 months; Severity of problem: moderate  OBJECTIVE: Mood: Anxious and Affect: Constricted Risk of harm to self or others: No plan to harm self or others  LIFE CONTEXT: Patient was diagnosed as HIV+ about 2 months ago. He reports that since that time he has been through varying emotions and needs someone to talk to. Patient has told his mother, sister, uncle, and best friend about his diagnosis. He reports that they have been supportive, but it bothers him that they have become more attentive and are treating him as though he is fragile since he disclosed to them. Patient works and has stable housing and income.   GOALS ADDRESSED: Patient will: 1. Demonstrate ability to: Increase healthy adjustment to current life circumstances  INTERVENTIONS: Interventions utilized: Motivational Interviewing, Supportive Counseling and Psychoeducation and/or Health Education   ASSESSMENT: Patient currently experiencing anxiety about the future, some depressed mood, unstable self-concept, and crying spells. He denies a history of anxiety or depression. 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   Counselor educated patient on counseling services available at [facility], including scope of practice,  scheduling procedures, and accessibility policies. Patient reports that he would very much like someone to talk to outside of his family. Patient states that his uncle has lived with HIV for years, but patient ever knew about it until he told his mother about his diagnosis. He reports that uncle tries to be there for him and relate to him, but patient is in a different place and has a different experience. Patient indicated that he is comfortable with his sexual orientation, but that being HIV+ made him feel bad about being gay, as though he had done something wrong. Counselor emphasized that HIV is not a punishment, and that many people of all orientations, genders, and walks of life have contracted it. Counselor and patient explored patient's concept of the stigma attached to HIV diagnosis.    Patient may benefit from weekly ongoing sessions of CBT and Reality Therapy to assist with adjustment.  PLAN: 1. Follow up with behavioral health clinician on : 12/17/18 @11am   Angus Palmsegina Alexander, LCSW

## 2018-12-09 NOTE — Patient Instructions (Addendum)
Wonderful to meet you today!   Biktarvy is the pill for your HIV infection - this will need to be taken once a day around the same time.  - Common side effects for a short time frame usually include headaches, nausea and diarrhea - OK to take over the counter tylenol for headaches and imodium for diarrhea - Try taking with food if you are nauseated  - Please separate from any multivitamins/supplements by 6 hours.   Lexapro is the other medication I want you to try - this is 0.5 pill once a day. Increase to 1 whole pill a day after 10-14 days if tolerating OK. Very low dose and only temporary. If this makes your depression worse please notify us right away.   Please sign up with MyChart to access your labs and set up email communication with our clinic for non-urgent medical concerns.   Please bring last 2 pay stubs to the clinic for your insurance application.   Please come back in 4 weeks to see Maurice Young again. We will need to do labs at this visit and give you two more vaccines.     Hepatitis B Vaccine, Recombinant injection What is this medicine? HEPATITIS B VACCINE (hep uh TAHY tis B VAK seen) is a vaccine. It is used to prevent an infection with the hepatitis B virus. This medicine may be used for other purposes; ask your health care provider or pharmacist if you have questions. COMMON BRAND NAME(S): Engerix-B, Recombivax HB What should I tell my health care provider before I take this medicine? They need to know if you have any of these conditions: -fever, infection -heart disease -hepatitis B infection -immune system problems -kidney disease -an unusual or allergic reaction to vaccines, yeast, other medicines, foods, dyes, or preservatives -pregnant or trying to get pregnant -breast-feeding How should I use this medicine? This vaccine is for injection into a muscle. It is given by a health care professional. A copy of Vaccine Information Statements will be given before each  vaccination. Read this sheet carefully each time. The sheet may change frequently. Talk to your pediatrician regarding the use of this medicine in children. While this drug may be prescribed for children as young as newborn for selected conditions, precautions do apply. Overdosage: If you think you have taken too much of this medicine contact a poison control center or emergency room at once. NOTE: This medicine is only for you. Do not share this medicine with others. What if I miss a dose? It is important not to miss your dose. Call your doctor or health care professional if you are unable to keep an appointment. What may interact with this medicine? -medicines that suppress your immune function like adalimumab, anakinra, infliximab -medicines to treat cancer -steroid medicines like prednisone or cortisone This list may not describe all possible interactions. Give your health care provider a list of all the medicines, herbs, non-prescription drugs, or dietary supplements you use. Also tell them if you smoke, drink alcohol, or use illegal drugs. Some items may interact with your medicine. What should I watch for while using this medicine? See your health care provider for all shots of this vaccine as directed. You must have 3 shots of this vaccine for protection from hepatitis B infection. Tell your doctor right away if you have any serious or unusual side effects after getting this vaccine. What side effects may I notice from receiving this medicine? Side effects that you should report to your doctor or  health care professional as soon as possible: -allergic reactions like skin rash, itching or hives, swelling of the face, lips, or tongue -breathing problems -confused, irritated -fast, irregular heartbeat -flu-like syndrome -numb, tingling pain -seizures -unusually weak or tired Side effects that usually do not require medical attention (report to your doctor or health care professional if  they continue or are bothersome): -diarrhea -fever -headache -loss of appetite -muscle pain -nausea -pain, redness, swelling, or irritation at site where injected -tiredness This list may not describe all possible side effects. Call your doctor for medical advice about side effects. You may report side effects to FDA at 1-800-FDA-1088. Where should I keep my medicine? This drug is given in a hospital or clinic and will not be stored at home. NOTE: This sheet is a summary. It may not cover all possible information. If you have questions about this medicine, talk to your doctor, pharmacist, or health care provider.  2018 Elsevier/Gold Standard (2014-04-19 13:26:01)

## 2018-12-09 NOTE — Progress Notes (Signed)
Name: Maurice Young  DOB: August 09, 1991 MRN: [id number] PCP: Patient, No Pcp Per   Patient Active Problem List   Diagnosis Date Noted  . Healthcare maintenance 12/10/2018  . Adjustment disorder with mixed anxiety and depressed mood 12/10/2018  . HIV (human immunodeficiency virus infection) ([place]) 12/08/2018     Brief Narrative:  Maurice Young is a 27 y.o. male with HIV infection; diagnosed 09/2018 with CD4 nadir 570 VL 28,000 . History of OIs: none. HIV Risk: MSM.   Previous Regimens: . naive  Genotypes: . 10/12/18 - K103N (R-efavirenz and nevirapine)  Subjective:  CC:  New patient here for HIV management.   HPI:  Maurice Young is here today for his first visit after recently finding out he was HIV-positive. He reports MSM sexual contacts with insertive/receptive anal and oral intercourse. He does not report many partners and estimates 2 regular contacts over the last year. He feels that he "knows who he got it from." To the best of his knowledge he has not had previous HIV testing. Denies complaints today suggestive of associated opportunistic infection or advancing HIV disease such as fevers, night sweats, cough, SOB, nausea, vomiting, diarrhea, headache, sensory changes, lymphadenopathy or oral thrush. He is having a difficult time accepting his diagnosis and finds that he is frequently feeling hopeless, disinterested in life, fleetingly suicidal at times, poor energy, low appetite and subjective weight loss. Just feels "queezy stomach all the time." He also has associated poor quality of sleep and only averaging 3-4 hours a night over the last 1-2 months.   Depression screen PHQ 2/9 12/09/2018  Decreased Interest 1  Down, Depressed, Hopeless 1  PHQ - 2 Score 2  Altered sleeping 1  Tired, decreased energy 1  Change in appetite 2  Feeling bad or failure about yourself  1  Trouble concentrating 1  Moving slowly or fidgety/restless 1  Suicidal thoughts 1  PHQ-9 Score 10  Difficult  doing work/chores Somewhat difficult   He does not have other comorbid conditions and otherwise in good health. He has not yet received flu vaccine this year.   Review of Systems  Constitutional: Negative for chills, fever, malaise/fatigue and weight loss.  HENT: Negative for sore throat and tinnitus.        No dental problems  Eyes: Negative for blurred vision and photophobia.  Respiratory: Negative for cough and sputum production.   Cardiovascular: Negative for chest pain and leg swelling.  Gastrointestinal: Negative for abdominal pain, diarrhea, nausea and vomiting.  Genitourinary: Negative for dysuria and flank pain.  Musculoskeletal: Negative for joint pain, myalgias and neck pain.  Skin: Negative for rash.  Neurological: Negative for dizziness, tingling and headaches.  Psychiatric/Behavioral: Positive for depression and suicidal ideas. Negative for substance abuse. The patient is nervous/anxious and has insomnia.   All other systems reviewed and are negative.   Past Medical History:  Diagnosis Date  . Asthma   . HIV (human immunodeficiency virus infection) ([place]) 12/08/2018   Dx 09/2018. VL - ; CD4 nadir -  Genotype 09-2018 K103N  HLA B*5701 (-) Hep A Ab (-)  Hep B sAg (-)  Hep B sAb (-)  Hep B cAb (-)  Hep C Ab (-)  Quanitferon (-)      Outpatient Medications Prior to Visit  Medication Sig Dispense Refill  . cyclobenzaprine (FLEXERIL) 10 MG tablet Take 10 mg by mouth daily as needed for muscle spasms (PAIN).    Marland Kitchen metoCLOPramide (REGLAN) 10 MG tablet Take 1 tablet (10 mg  total) by mouth every 6 (six) hours as needed for nausea or vomiting. (Patient not taking: Reported on 07/14/2018) 30 tablet 0  . potassium chloride (K-DUR) 10 MEQ tablet Take 1 tablet (10 mEq total) by mouth daily. (Patient not taking: Reported on 07/14/2018) 7 tablet 0  . promethazine (PHENERGAN) 25 MG tablet Take 1 tablet (25 mg total) by mouth every 6 (six) hours as needed for nausea. (Patient not taking:  Reported on 07/14/2018) 20 tablet 0   No facility-administered medications prior to visit.      Allergies  Allergen Reactions  . Ibuprofen Anaphylaxis and Swelling    Social History   Tobacco Use  . Smoking status: Current Every Day Smoker    Packs/day: 0.50    Types: Cigarettes  . Smokeless tobacco: Never Used  Substance Use Topics  . Alcohol use: Yes    Alcohol/week: 2.0 standard drinks    Types: 2 Cans of beer per week    Comment: occasional/socially  . Drug use: Yes    Frequency: 7.0 times per week    Types: Marijuana    Family History  Problem Relation Age of Onset  . Healthy Mother   . Healthy Father   . Diabetes Other        "both sides of the family, not sure who"   . Cancer Other     Social History   Substance and Sexual Activity  Sexual Activity Not Currently   Comment: declined condoms     Objective:   Vitals:   12/09/18 1032  BP: 116/75  Pulse: 97  Temp: 98 F (36.7 C)  Weight: 250 lb (113.4 kg)  Height: 6' 2" (1.88 m)   Body mass index is 32.1 kg/m.  Physical Exam  Constitutional: He is oriented to person, place, and time. He appears well-developed and well-nourished.  Seated comfortably in chair during visit. Appears well.   HENT:  Mouth/Throat: Oropharynx is clear and moist and mucous membranes are normal. Normal dentition. No dental abscesses.  Cardiovascular: Normal rate, regular rhythm and normal heart sounds.  Pulmonary/Chest: Effort normal and breath sounds normal.  Abdominal: Soft. He exhibits no distension. There is no tenderness.  Lymphadenopathy:    He has no cervical adenopathy.  Neurological: He is alert and oriented to person, place, and time.  Skin: Skin is warm and dry. No rash noted.  Psychiatric: He has a normal mood and affect. Judgment normal.  Engaged in discussion. Tearful and emotionally distressed at times.   Vitals reviewed.   Lab Results Lab Results  Component Value Date   WBC 6.0 10/30/2018   HGB  14.5 10/30/2018   HCT 41.3 10/30/2018   MCV 89.6 10/30/2018   PLT 271 10/30/2018    Lab Results  Component Value Date   CREATININE 1.07 10/30/2018   BUN 15 10/30/2018   NA 140 10/30/2018   K 3.8 10/30/2018   CL 104 10/30/2018   CO2 29 10/30/2018    Lab Results  Component Value Date   ALT 12 10/30/2018   AST 19 10/30/2018   ALKPHOS 64 02/08/2018   BILITOT 0.6 10/30/2018    Lab Results  Component Value Date   CHOL 129 10/30/2018   HDL 36 (L) 10/30/2018   LDLCALC 61 10/30/2018   TRIG 271 (H) 10/30/2018   CHOLHDL 3.6 10/30/2018   HIV 1 RNA Quant (copies/mL)  Date Value  10/30/2018 28,000 (H)   CD4 T Cell Abs (/uL)  Date Value  10/30/2018 570  Assessment & Plan:   Problem List Items Addressed This Visit      Unprioritized   Adjustment disorder with mixed anxiety and depressed mood    He has had persistent symptoms for 2 months with PHQ-9 score 10. Only fleeting thoughts of suicide; never plan or attempt history. His mother knows about his struggles and feels he can lean on her. Will start low dose lexapro 5 mg QD with increase to 10 mg QD after 14 days if he is tolerant.       Healthcare maintenance    Flu vaccine today.  Hep B #1 today. At next visit in 4 weeks will need Hep B #2 and Prevnar.        HIV (human immunodeficiency virus infection) ([place])    New patient here to establish for HIV care. I discussed with Mordecai Maes treatment options/side effects, benefits of treatment and long-term outcomes. I discussed how HIV is transmitted and the process of untreated HIV including increased risk for opportunistic infections, cancer, dementia and renal failure. Patient was counseled on routine HIV care including medication adherence, blood monitoring, necessary vaccines and follow up visits. Counseled regarding safe sex practices including: condom use, partner disclosure, limiting partners. Patient spent time talking with our pharmacist Cassie regarding successful  practices of ART and understands to reach out to our clinic in the future with questions.   Will start [place] for HIV treatment. He still needs to bring in pay stubs (which he will get today) for UMAP enrollment - will ask for Advancing Access to bridge until approved. He is ready to start medications and desires this to happen asap.   General introduction to our clinic and integrated services. She was introduced to [place] and [place] today - he will schedule follow up with Rollene Fare soon to help in conjunction with SSRI.   I spent greater than 45 minutes with the patient today. Greater than 75% of the time spent face-to-face counseling and coordination of care re: HIV and health maintenance.        Relevant Medications   bictegravir-emtricitabine-tenofovir AF (BIKTARVY) 50-200-25 MG TABS tablet    Other Visit Diagnoses    Human immunodeficiency virus (HIV) disease ([place])    -  Primary   Relevant Medications   bictegravir-emtricitabine-tenofovir AF (BIKTARVY) 50-200-25 MG TABS tablet   Need for immunization against influenza       Relevant Orders   Flu Vaccine QUAD 36+ mos IM (Completed)   Need for hepatitis B vaccination       Relevant Orders   Hepatitis B vaccine adult IM (Completed)     Return in about 4 weeks (around 01/06/2019).   [name], MSN, NP-C [facility] [place] Pager: [phone] Office: [phone]  12/10/18  1:32 PM

## 2018-12-09 NOTE — Progress Notes (Signed)
HPI: Maurice Young is a 27 y.o. male who presents to the [facility] clinic today to establish care for his newly diagnosed HIV infection with Maurice Young.  Patient Active Problem List   Diagnosis Date Noted  . HIV (human immunodeficiency virus infection) ([place]) 12/08/2018    Patient's Medications  New Prescriptions   No medications on file  Previous Medications   BICTEGRAVIR-EMTRICITABINE-TENOFOVIR AF (BIKTARVY) 50-200-25 MG TABS TABLET    Take 1 tablet by mouth daily.   CYCLOBENZAPRINE (FLEXERIL) 10 MG TABLET    Take 10 mg by mouth daily as needed for muscle spasms (PAIN).   ESCITALOPRAM (LEXAPRO) 10 MG TABLET    Take 1 tablet (10 mg total) by mouth daily.   METOCLOPRAMIDE (REGLAN) 10 MG TABLET    Take 1 tablet (10 mg total) by mouth every 6 (six) hours as needed for nausea or vomiting.   POTASSIUM CHLORIDE (K-DUR) 10 MEQ TABLET    Take 1 tablet (10 mEq total) by mouth daily.   PROMETHAZINE (PHENERGAN) 25 MG TABLET    Take 1 tablet (25 mg total) by mouth every 6 (six) hours as needed for nausea.  Modified Medications   No medications on file  Discontinued Medications   No medications on file    Allergies: Allergies  Allergen Reactions  . Ibuprofen Anaphylaxis and Swelling    Past Medical History: Past Medical History:  Diagnosis Date  . Asthma   . HIV (human immunodeficiency virus infection) ([place]) 12/08/2018   Dx 09/2018. VL - ; CD4 nadir -  Genotype 09-2018 K103N  HLA B*5701 (-) Hep A Ab (-)  Hep B sAg (-)  Hep B sAb (-)  Hep B cAb (-)  Hep C Ab (-)  Quanitferon (-)      Social History: Social History   Socioeconomic History  . Marital status: Single    Spouse name: Not on file  . Number of children: Not on file  . Years of education: Not on file  . Highest education level: Not on file  Occupational History  . Not on file  Social Needs  . Financial resource strain: Not on file  . Food insecurity:    Worry: Not on file    Inability: Not on file  . Transportation needs:     Medical: Not on file    Non-medical: Not on file  Tobacco Use  . Smoking status: Current Every Day Smoker    Packs/day: 0.50    Types: Cigarettes  . Smokeless tobacco: Never Used  Substance and Sexual Activity  . Alcohol use: Yes    Alcohol/week: 2.0 standard drinks    Types: 2 Cans of beer per week    Comment: occasional/socially  . Drug use: Yes    Frequency: 7.0 times per week    Types: Marijuana  . Sexual activity: Not Currently    Comment: declined condoms  Lifestyle  . Physical activity:    Days per week: Not on file    Minutes per session: Not on file  . Stress: Not on file  Relationships  . Social connections:    Talks on phone: Not on file    Gets together: Not on file    Attends religious service: Not on file    Active member of club or organization: Not on file    Attends meetings of clubs or organizations: Not on file    Relationship status: Not on file  Other Topics Concern  . Not on file  Social History Narrative  .  Not on file    Labs: Lab Results  Component Value Date   HIV1RNAQUANT 28,000 (H) 10/30/2018   CD4TABS 570 10/30/2018    RPR and STI Lab Results  Component Value Date   LABRPR NON-REACTIVE 10/30/2018    STI Results GC CT  10/30/2018 Negative Negative    Hepatitis B Lab Results  Component Value Date   HEPBSAB NON-REACTIVE 10/30/2018   HEPBSAG NON-REACTIVE 10/30/2018   HEPBCAB NON-REACTIVE 10/30/2018   Hepatitis C Lab Results  Component Value Date   HEPCAB NON-REACTIVE 10/30/2018   Hepatitis A Lab Results  Component Value Date   HAV NON-REACTIVE 10/30/2018   Lipids: Lab Results  Component Value Date   CHOL 129 10/30/2018   TRIG 271 (H) 10/30/2018   HDL 36 (L) 10/30/2018   CHOLHDL 3.6 10/30/2018   [place] 61 10/30/2018    Current HIV Regimen: Treatment naive  Assessment: Maurice Young is here today to establish care for his newly diagnosed HIV infection.  He has an initial HIV viral load of 28,000 and a CD4 count  of 570.  We will start him on Biktarvy today.  Explained that Maurice Young is a one pill once daily medication with or without food and the importance of not missing any doses. Explained resistance and how it develops and why it is so important to take Biktarvy daily and not skip days or doses. Counseled patient to take it around the same time each day. Counseled on what to do if dose is missed, if closer to missed dose take immediately, if closer to next dose then skip and resume normal schedule. Cautioned on possible side effects the first week or so including nausea, diarrhea, dizziness, and headaches but that they should resolve after the first couple of weeks. I reviewed patient medications and found no drug interactions. Counseled patient to separate Biktarvy from divalent cations including multivitamins. Discussed with patient to call clinic if he starts a new medication or herbal supplement. I gave the patient my card and told him to call me with any issues/questions/concerns.  He is applying for ADAP but will bring back his paystubs.  I was able to get him approved for [facility] assistance for an immediate supply.  He will pick it up at [facility] today.  Plan: - Start Biktarvy PO once daily - F/u with Maurice Young 1/7 at [place]. Maurice Young, PharmD, BCIDP, AAHIVP, [place] 12/09/2018, 4:14 PM

## 2018-12-10 DIAGNOSIS — F4323 Adjustment disorder with mixed anxiety and depressed mood: Secondary | ICD-10-CM | POA: Insufficient documentation

## 2018-12-10 DIAGNOSIS — Z0000 Encounter for general adult medical examination without abnormal findings: Secondary | ICD-10-CM | POA: Insufficient documentation

## 2018-12-10 NOTE — Assessment & Plan Note (Signed)
Flu vaccine today.  Hep B #1 today. At next visit in 4 weeks will need Hep B #2 and Prevnar.

## 2018-12-10 NOTE — Assessment & Plan Note (Signed)
He has had persistent symptoms for 2 months with PHQ-9 score 10. Only fleeting thoughts of suicide; never plan or attempt history. His mother knows about his struggles and feels he can lean on her. Will start low dose lexapro 5 mg QD with increase to 10 mg QD after 14 days if he is tolerant.

## 2018-12-10 NOTE — Assessment & Plan Note (Signed)
New patient here to establish for HIV care. I discussed with Bari Mantisakeem Arcia treatment options/side effects, benefits of treatment and long-term outcomes. I discussed how HIV is transmitted and the process of untreated HIV including increased risk for opportunistic infections, cancer, dementia and renal failure. Patient was counseled on routine HIV care including medication adherence, blood monitoring, necessary vaccines and follow up visits. Counseled regarding safe sex practices including: condom use, partner disclosure, limiting partners. Patient spent time talking with our pharmacist Cassie regarding successful practices of ART and understands to reach out to our clinic in the future with questions.   Will start BIKTARVY for HIV treatment. He still needs to bring in pay stubs (which he will get today) for UMAP enrollment - will ask for Advancing Access to bridge until approved. He is ready to start medications and desires this to happen asap.   General introduction to our clinic and integrated services. She was introduced to [place] and [facility] today - he will schedule follow up with Rene Kocheregina soon to help in conjunction with SSRI.   I spent greater than 45 minutes with the patient today. Greater than 75% of the time spent face-to-face counseling and coordination of care re: HIV and health maintenance.

## 2018-12-17 ENCOUNTER — Ambulatory Visit: Payer: Self-pay | Admitting: Licensed Clinical Social Worker

## 2018-12-19 ENCOUNTER — Telehealth: Payer: Self-pay | Admitting: *Deleted

## 2018-12-19 ENCOUNTER — Encounter: Payer: Self-pay | Admitting: Infectious Diseases

## 2018-12-19 NOTE — Telephone Encounter (Signed)
Patient called with questions after starting his Biktarvy on 12/13.  He has noticed a fullness feeling at the top of his stomach lasting for 3 days ("feels like I have to burp but I cannot").  He also has had increased bowel movements since starting treatment - 6 "fluffy" stools a day.  Today he noticed a few drops of bright red blood in the toilet bowl and bright red blood on the toilet paper when he wiped, but it stopped after 2 wipes.  He feels concerned, wants to know if there is anything to worry about or if he can take anything to help with his stomach ache.  He has not tried anything over the counter as of yet, wants to know what might be safe and work.  He states that in the past promethazine made him feel jittery and reglan worked for nausea.  RN advised patient that the initial gastric symptoms usually resolve during the first month of treatment, but [facility]  He follows up 1/7. Andree CossHowell, Coleta Grosshans M, RN

## 2018-12-22 NOTE — Telephone Encounter (Signed)
He can try some simethicone (Gasx) to see if it breaks things up. Otherwise we can try some reglan for him if that has worked in the past. I think another 2 weeks on the biktarvy he will start feeling better.

## 2018-12-25 MED ORDER — METOCLOPRAMIDE HCL 10 MG PO TABS: 10.0000 mg | ORAL_TABLET | Freq: Four times a day (QID) | ORAL | 0 refills | Status: DC | PRN

## 2018-12-25 MED FILL — METOCLOPRAMIDE 10 MG TABLET: 10 | 7 days supply | Qty: 30 | Fill #0

## 2018-12-25 NOTE — Telephone Encounter (Signed)
Thanks

## 2018-12-25 NOTE — Addendum Note (Signed)
Addended by: Blanchard KelchIXON, Rochanda Harpham N on: 12/25/2018 12:08 PM   Modules accepted: Orders

## 2018-12-25 NOTE — Telephone Encounter (Signed)
Reglan rx sent into [facility]. Thank you!

## 2018-12-25 NOTE — Telephone Encounter (Signed)
Spoke with patient. Taking Biktarvy with food, feels a bit better, but still queasy. He is still having fluffy bowel movements, but now only 4 a day. Would like to try reglan.  He will follow up with [facility] 1/7, wants to know if Maurice MessierKathy received his pay stubs, but will bring them again 1/7. Andree CossHowell, Michelle M, RN

## 2019-01-05 MED FILL — BIKTARVY 50-200-25 MG TABS: 50-200-25 | 30 days supply | Qty: 30 | Fill #1

## 2019-01-06 ENCOUNTER — Encounter: Payer: Self-pay | Admitting: Infectious Diseases

## 2019-01-06 ENCOUNTER — Ambulatory Visit (INDEPENDENT_AMBULATORY_CARE_PROVIDER_SITE_OTHER): Payer: Self-pay | Admitting: Infectious Diseases

## 2019-01-06 VITALS — BP 138/78 | HR 76 | Temp 98.1°F | Wt 254.0 lb

## 2019-01-06 DIAGNOSIS — F4323 Adjustment disorder with mixed anxiety and depressed mood: Secondary | ICD-10-CM

## 2019-01-06 DIAGNOSIS — Z21 Asymptomatic human immunodeficiency virus [HIV] infection status: Secondary | ICD-10-CM

## 2019-01-06 DIAGNOSIS — F5102 Adjustment insomnia: Secondary | ICD-10-CM

## 2019-01-06 DIAGNOSIS — B20 Human immunodeficiency virus [HIV] disease: Secondary | ICD-10-CM

## 2019-01-06 DIAGNOSIS — Z0000 Encounter for general adult medical examination without abnormal findings: Secondary | ICD-10-CM

## 2019-01-06 DIAGNOSIS — G4700 Insomnia, unspecified: Secondary | ICD-10-CM | POA: Insufficient documentation

## 2019-01-06 DIAGNOSIS — Z23 Encounter for immunization: Secondary | ICD-10-CM

## 2019-01-06 MED FILL — METOCLOPRAMIDE 10 MG TABLET: 10 | 7 days supply | Qty: 30 | Fill #0

## 2019-01-06 NOTE — Assessment & Plan Note (Signed)
Multifactorial - discussed and provided with sleep hygiene strategies.

## 2019-01-06 NOTE — Progress Notes (Signed)
Name: Maurice Young  DOB: October 04, 1991 MRN: [id number] PCP: Patient, No Pcp Per   Patient Active Problem List   Diagnosis Date Noted  . Insomnia 01/06/2019  . Healthcare maintenance 12/10/2018  . Adjustment disorder with mixed anxiety and depressed mood 12/10/2018  . HIV (human immunodeficiency virus infection) ([place]) 12/08/2018     Brief Narrative:  Maurice Young is a 28 y.o. male with HIV infection; diagnosed 09/2018 with CD4 nadir 570 VL 28,000 . History of OIs: none. HIV Risk: MSM.   Previous Regimens: . naive  Genotypes: . 10/12/18 - K103N (R-efavirenz and nevirapine)  Subjective:  CC:  HIV follow up. Some abdominal pain (3/10) with taking biktarvy.   HPI:  Maurice Young is feeling well today. Still reports some "queesiness" with his Biktarvy and dull abdominal pain. He feels this is improved with taking Biktarvy with medications but does not always do so. He has had one day where he could not remember if he took medicine and may have doubled up dose. He uses a reminder on his phone to help with this. No diarrhea or vomiting. Still sleeping poorly (2-4 hours a night). Sounds like he falls asleep with the TV on and watches when he wakes at night. Usually puts himself to bed around 10:30 and wakes at 2 and up and down until he gets up to start day. Has not started lexapro yet as he was concerned about s/e listed. He feels that his mood has improved, still decreased interest and depressed moments but better energy and does not feel bad about himself any more. Stopped smoking cigarettes 3 days ago cold [place]. Would like to add back exercise to help with mood/sleep and overall fitness.    Review of Systems  Constitutional: Negative for chills, fever, malaise/fatigue and weight loss.  HENT: Negative for sore throat and tinnitus.        No dental problems  Eyes: Negative for blurred vision and photophobia.  Respiratory: Negative for cough and sputum production.   Cardiovascular: Negative  for chest pain and leg swelling.  Gastrointestinal: Negative for abdominal pain, diarrhea, nausea and vomiting.  Genitourinary: Negative for dysuria and flank pain.  Musculoskeletal: Negative for joint pain, myalgias and neck pain.  Skin: Negative for rash.  Neurological: Negative for dizziness, tingling and headaches.  Psychiatric/Behavioral: Positive for depression and suicidal ideas. Negative for substance abuse. The patient is nervous/anxious and has insomnia.   All other systems reviewed and are negative.   Past Medical History:  Diagnosis Date  . Asthma   . HIV (human immunodeficiency virus infection) ([place]) 12/08/2018   Dx 09/2018. VL - ; CD4 nadir -  Genotype 09-2018 K103N  HLA B*5701 (-) Hep A Ab (-)  Hep B sAg (-)  Hep B sAb (-)  Hep B cAb (-)  Hep C Ab (-)  Quanitferon (-)      Outpatient Medications Prior to Visit  Medication Sig Dispense Refill  . bictegravir-emtricitabine-tenofovir AF (BIKTARVY) 50-200-25 MG TABS tablet Take 1 tablet by mouth daily. 30 tablet 11  . cyclobenzaprine (FLEXERIL) 10 MG tablet Take 10 mg by mouth daily as needed for muscle spasms (PAIN).    Marland Kitchen escitalopram (LEXAPRO) 10 MG tablet Take 1 tablet (10 mg total) by mouth daily. (Patient not taking: Reported on 01/06/2019) 30 tablet 3  . metoCLOPramide (REGLAN) 10 MG tablet Take 1 tablet (10 mg total) by mouth every 6 (six) hours as needed for nausea or vomiting. (Patient not taking: Reported on 01/06/2019) 30 tablet 0  No facility-administered medications prior to visit.      Allergies  Allergen Reactions  . Ibuprofen Anaphylaxis and Swelling    Social History   Tobacco Use  . Smoking status: Former Smoker    Packs/day: 0.50    Types: Cigarettes  . Smokeless tobacco: Never Used  . Tobacco comment: stopped 01/02/18  Substance Use Topics  . Alcohol use: Yes    Alcohol/week: 2.0 standard drinks    Types: 2 Cans of beer per week    Comment: occasional/socially  . Drug use: Yes    Frequency: 7.0  times per week    Types: Marijuana    Social History   Substance and Sexual Activity  Sexual Activity Not Currently   Comment: declined condoms    Objective:   Vitals:   01/06/19 1101  BP: 138/78  Pulse: 76  Temp: 98.1 F (36.7 C)  TempSrc: Oral  Weight: 254 lb (115.2 kg)   Body mass index is 32.61 kg/m.  Physical Exam Vitals signs reviewed.  Constitutional:      Appearance: He is well-developed.     Comments: Seated comfortably in chair during visit. Appears well.   HENT:     Mouth/Throat:     Dentition: Normal dentition. No dental abscesses.  Cardiovascular:     Rate and Rhythm: Normal rate and regular rhythm.     Heart sounds: Normal heart sounds.  Pulmonary:     Effort: Pulmonary effort is normal.     Breath sounds: Normal breath sounds.  Abdominal:     General: There is no distension.     Palpations: Abdomen is soft.     Tenderness: There is no abdominal tenderness.  Lymphadenopathy:     Cervical: No cervical adenopathy.  Skin:    General: Skin is warm and dry.     Findings: No rash.  Neurological:     Mental Status: He is alert and oriented to person, place, and time.  Psychiatric:        Judgment: Judgment normal.     Comments: Engaged in discussion. Tearful and emotionally distressed at times.     Lab Results Lab Results  Component Value Date   WBC 6.0 10/30/2018   HGB 14.5 10/30/2018   HCT 41.3 10/30/2018   MCV 89.6 10/30/2018   PLT 271 10/30/2018    Lab Results  Component Value Date   CREATININE 1.07 10/30/2018   BUN 15 10/30/2018   NA 140 10/30/2018   K 3.8 10/30/2018   CL 104 10/30/2018   CO2 29 10/30/2018    Lab Results  Component Value Date   ALT 12 10/30/2018   AST 19 10/30/2018   ALKPHOS 64 02/08/2018   BILITOT 0.6 10/30/2018    Lab Results  Component Value Date   CHOL 129 10/30/2018   HDL 36 (L) 10/30/2018   LDLCALC 61 10/30/2018   TRIG 271 (H) 10/30/2018   CHOLHDL 3.6 10/30/2018   HIV 1 RNA Quant (copies/mL)    Date Value  10/30/2018 28,000 (H)   CD4 T Cell Abs (/uL)  Date Value  10/30/2018 570     Assessment & Plan:   Problem List Items Addressed This Visit      Unprioritized   HIV (human immunodeficiency virus infection) ([place])    Doing well with adherence - only 1 possible double dose. Discussed strategies to remedy this. Will try pill tray and phone app for medication reminder system. Check creatinine and VL today. Encouraged to continue to abstain from  sex or strict condom use. Majority of today's visit was spent in discussion over psychologic effect over major diagnosis and how to move forward. Will follow up again in 8 weeks.       Healthcare maintenance    Hep B 2/3 today. Next dose due 06-2019.  Will need prevnar at next appt in 2 months.       Adjustment disorder with mixed anxiety and depressed mood    Has not started lexapro yet d/t concern over s/e; discussed that at this point he likely can defer and continue to work on strategies and tactics to help with his mood as he is currently doing. I do think that much of his queasiness is due to anxiety over taking his medicine not direct s/e. Acknowledged and validated that what he is experiencing likely will continue to improve as he establishes daily routine with taking his medication to manage his condition. He has good support at home with his mother and other family members. Follow up again at next appt.       Insomnia    Multifactorial - discussed and provided with sleep hygiene strategies.        Other Visit Diagnoses    Human immunodeficiency virus (HIV) disease ([place])    -  Primary   Relevant Orders   HIV-1 RNA quant-no reflex-bld   Creatinine   Hepatitis B vaccine adult IM (Completed)   Need for hepatitis B vaccination       Relevant Orders   Hepatitis B vaccine adult IM (Completed)     Return in about 2 months (around 03/07/2019) for follow up.   [name], MSN, NP-C [facility] for Infectious  [place] Pager: [phone] Office: [phone]  01/06/19  12:18 PM

## 2019-01-06 NOTE — Assessment & Plan Note (Signed)
Has not started lexapro yet d/t concern over s/e; discussed that at this point he likely can defer and continue to work on strategies and tactics to help with his mood as he is currently doing. I do think that much of his queasiness is due to anxiety over taking his medicine not direct s/e. Acknowledged and validated that what he is experiencing likely will continue to improve as he establishes daily routine with taking his medication to manage his condition. He has good support at home with his mother and other family members. Follow up again at next appt.

## 2019-01-06 NOTE — Patient Instructions (Addendum)
Continue your Biktarvy every day as you are. Will send your results via MyChart.   Vaccines given today:   Hep B 2/3 (Next dose due 06-07-2019)  Recommendations for improving sleep:   Avoid having pets sleep in the bedroom  Avoid caffeine consumption after 4pm  Keep bedroom cool and conducive to sleep  Avoid nicotine use, especially in the evening  Avoid exercise within 2-3 hours before bedtime  Take the TV out of your bedroom!!  Stimulus Control:   Go to bed only when sleepy  Use the bedroom for sleep and sex only  Go to another room if you are unable to fall asleep within 15 to 20 minutes  Read or engage in other quiet activities and return to bed only when sleepy.  I find Valerian root and chamomile tea helpful before bed and putting yourself. Would return back to some regular exercise with cardio 15 minutes a day and slowly increase. Add back resistance next.

## 2019-01-06 NOTE — Assessment & Plan Note (Addendum)
Doing well with adherence - only 1 possible double dose. Discussed strategies to remedy this. Will try pill tray and phone app for medication reminder system. Check creatinine and VL today. Encouraged to continue to abstain from sex or strict condom use. Majority of today's visit was spent in discussion over psychologic effect over major diagnosis and how to move forward. Will follow up again in 8 weeks.

## 2019-01-06 NOTE — Assessment & Plan Note (Signed)
Hep B 2/3 today. Next dose due 06-2019.  Will need prevnar at next appt in 2 months.

## 2019-01-08 LAB — HIV-1 RNA QUANT-NO REFLEX-BLD
HIV 1 RNA QUANT: 78 {copies}/mL — AB
HIV-1 RNA Quant, Log: 1.89 Log copies/mL — ABNORMAL HIGH

## 2019-01-08 LAB — CREATININE, SERUM: Creat: 1 mg/dL (ref 0.60–1.35)

## 2019-01-09 ENCOUNTER — Ambulatory Visit: Payer: Self-pay

## 2019-02-02 MED FILL — BIKTARVY 50-200-25 MG TABS: 50-200-25 | 30 days supply | Qty: 30 | Fill #2

## 2019-02-10 ENCOUNTER — Other Ambulatory Visit: Payer: Self-pay | Admitting: Behavioral Health

## 2019-02-10 ENCOUNTER — Encounter (HOSPITAL_COMMUNITY): Payer: Self-pay | Admitting: Emergency Medicine

## 2019-02-10 ENCOUNTER — Emergency Department (HOSPITAL_COMMUNITY)
Admission: EM | Admit: 2019-02-10 | Discharge: 2019-02-10 | Disposition: A | Discharge status: Home / Self Care | Payer: Self-pay | Attending: Emergency Medicine | Admitting: Emergency Medicine

## 2019-02-10 DIAGNOSIS — B20 Human immunodeficiency virus [HIV] disease: Secondary | ICD-10-CM

## 2019-02-10 DIAGNOSIS — F17210 Nicotine dependence, cigarettes, uncomplicated: Secondary | ICD-10-CM | POA: Insufficient documentation

## 2019-02-10 DIAGNOSIS — R42 Dizziness and giddiness: Secondary | ICD-10-CM | POA: Insufficient documentation

## 2019-02-10 DIAGNOSIS — Z79899 Other long term (current) drug therapy: Secondary | ICD-10-CM | POA: Insufficient documentation

## 2019-02-10 DIAGNOSIS — J45909 Unspecified asthma, uncomplicated: Secondary | ICD-10-CM | POA: Insufficient documentation

## 2019-02-10 LAB — URINALYSIS, ROUTINE W REFLEX MICROSCOPIC
Bacteria, UA: NONE SEEN
Bilirubin Urine: NEGATIVE
Glucose, UA: NEGATIVE mg/dL
Ketones, ur: NEGATIVE mg/dL
Leukocytes,Ua: NEGATIVE
Nitrite: NEGATIVE
Protein, ur: NEGATIVE mg/dL
Specific Gravity, Urine: 1.027 (ref 1.005–1.030)
pH: 6 (ref 5.0–8.0)

## 2019-02-10 LAB — CBC WITH DIFFERENTIAL/PLATELET
ABS IMMATURE GRANULOCYTES: 0.04 10*3/uL (ref 0.00–0.07)
Basophils Absolute: 0 10*3/uL (ref 0.0–0.1)
Basophils Relative: 0 %
Eosinophils Absolute: 0.1 10*3/uL (ref 0.0–0.5)
Eosinophils Relative: 1 %
HCT: 46.1 % (ref 39.0–52.0)
HEMOGLOBIN: 15 g/dL (ref 13.0–17.0)
Immature Granulocytes: 0 %
LYMPHS PCT: 16 %
Lymphs Abs: 1.9 10*3/uL (ref 0.7–4.0)
MCH: 31.1 pg (ref 26.0–34.0)
MCHC: 32.5 g/dL (ref 30.0–36.0)
MCV: 95.4 fL (ref 80.0–100.0)
Monocytes Absolute: 1.1 10*3/uL — ABNORMAL HIGH (ref 0.1–1.0)
Monocytes Relative: 9 %
NEUTROS ABS: 8.7 10*3/uL — AB (ref 1.7–7.7)
Neutrophils Relative %: 74 %
PLATELETS: 285 10*3/uL (ref 150–400)
RBC: 4.83 MIL/uL (ref 4.22–5.81)
RDW: 11.9 % (ref 11.5–15.5)
WBC: 11.9 10*3/uL — AB (ref 4.0–10.5)
nRBC: 0 % (ref 0.0–0.2)

## 2019-02-10 LAB — RAPID URINE DRUG SCREEN, HOSP PERFORMED
AMPHETAMINES: NOT DETECTED
Barbiturates: NOT DETECTED
Benzodiazepines: NOT DETECTED
Cocaine: NOT DETECTED
Opiates: NOT DETECTED
Tetrahydrocannabinol: POSITIVE — AB

## 2019-02-10 LAB — COMPREHENSIVE METABOLIC PANEL
ALT: 12 U/L (ref 0–44)
AST: 21 U/L (ref 15–41)
Albumin: 4.3 g/dL (ref 3.5–5.0)
Alkaline Phosphatase: 55 U/L (ref 38–126)
Anion gap: 8 (ref 5–15)
BUN: 11 mg/dL (ref 6–20)
CHLORIDE: 103 mmol/L (ref 98–111)
CO2: 26 mmol/L (ref 22–32)
CREATININE: 0.91 mg/dL (ref 0.61–1.24)
Calcium: 9.1 mg/dL (ref 8.9–10.3)
GFR calc Af Amer: >60 mL/min (ref >60)
GFR calc non Af Amer: >60 mL/min (ref >60)
Glucose, Bld: 85 mg/dL (ref 70–99)
Potassium: 4.1 mmol/L (ref 3.5–5.1)
Sodium: 137 mmol/L (ref 135–145)
Total Bilirubin: 1 mg/dL (ref 0.3–1.2)
Total Protein: 7.8 g/dL (ref 6.5–8.1)

## 2019-02-10 LAB — ETHANOL: Alcohol, Ethyl (B): <10 mg/dL (ref <10)

## 2019-02-10 MED ORDER — DEXAMETHASONE SODIUM PHOSPHATE 10 MG/ML IJ SOLN
10.0000 mg | Freq: Once | INTRAMUSCULAR | Status: AC
  Administered 2019-02-10: 10 mg via INTRAVENOUS
  Filled 2019-02-10: qty 1

## 2019-02-10 MED ORDER — METOCLOPRAMIDE HCL 5 MG/ML IJ SOLN
10.0000 mg | Freq: Once | INTRAMUSCULAR | Status: AC
  Administered 2019-02-10: 10 mg via INTRAVENOUS
  Filled 2019-02-10: qty 2

## 2019-02-10 MED ORDER — SODIUM CHLORIDE 0.9 % IV BOLUS
1000.0000 mL | Freq: Once | INTRAVENOUS | Status: AC
  Administered 2019-02-10: 1000 mL via INTRAVENOUS

## 2019-02-10 MED ORDER — BICTEGRAVIR-EMTRICITAB-TENOFOV 50-200-25 MG PO TABS: 1.0000 | ORAL_TABLET | Freq: Every day | ORAL | 10 refills | Status: DC

## 2019-02-10 NOTE — ED Provider Notes (Signed)
[facility] DEPT Provider Note   CSN: [id number] Arrival date & time: 02/10/19  1127     History   Chief Complaint Chief Complaint  Patient presents with  . Dizziness    HPI Maurice Young is a 28 y.o. male.  HPI   Maurice Young is a 28 y.o. male, with a history of asthma and HIV, presenting to the ED with lightheadedness beginning about three days ago. Sensation is described a "woozy" feeling and unsteadiness that occurs with change in position or long stretches of walking around at work.  Also complains of a headache intermittent for the last four days.  These headaches seem to radiate from the back of the head around to the right parietal region, throbbing, mild to moderate.  He states he has been under increased stress lately. Denies new medications. Occasional alcohol use. Denies drug use.  Denies current dizziness or lightheadedness. Denies fever/chills, chest pain, shortness of breath, N/V/D, congestion, abdominal pain, falls/trauma, head injury, neurologic deficits, syncope, neck pain/stiffness, vision loss, or any other complaints.   Last HIV quant was 78 Jan 06, 2019. Last CD4 count was 570 Oct 30, 2018.   Past Medical History:  Diagnosis Date  . Asthma   . HIV (human immunodeficiency virus infection) ([place]) 12/08/2018   Dx 09/2018. VL - ; CD4 nadir -  Genotype 09-2018 K103N  HLA B*5701 (-) Hep A Ab (-)  Hep B sAg (-)  Hep B sAb (-)  Hep B cAb (-)  Hep C Ab (-)  Quanitferon (-)      Patient Active Problem List   Diagnosis Date Noted  . Insomnia 01/06/2019  . Healthcare maintenance 12/10/2018  . Adjustment disorder with mixed anxiety and depressed mood 12/10/2018  . HIV (human immunodeficiency virus infection) ([place]) 12/08/2018    Past Surgical History:  Procedure Laterality Date  . NO PAST SURGERIES          Home Medications    Prior to Admission medications   Medication Sig Start Date End Date Taking? Authorizing Provider    metoCLOPramide (REGLAN) 10 MG tablet Take 1 tablet (10 mg total) by mouth every 6 (six) hours as needed for nausea or vomiting. 12/25/18  Yes [place] Callas, NP  bictegravir-emtricitabine-tenofovir AF (BIKTARVY) 50-200-25 MG TABS tablet Take 1 tablet by mouth daily. 02/10/19   [place] Callas, NP  escitalopram (LEXAPRO) 10 MG tablet Take 1 tablet (10 mg total) by mouth daily. Patient not taking: Reported on 02/10/2019 12/09/18   [place] Callas, NP    Family History Family History  Problem Relation Age of Onset  . Healthy Mother   . Healthy Father   . Diabetes Other        "both sides of the family, not sure who"   . Cancer Other     Social History Social History   Tobacco Use  . Smoking status: Current Every Day Smoker    Packs/day: 0.50    Types: Cigarettes  . Smokeless tobacco: Never Used  Substance Use Topics  . Alcohol use: Yes    Alcohol/week: 2.0 standard drinks    Types: 2 Cans of beer per week    Comment: occasional/socially  . Drug use: Yes    Frequency: 7.0 times per week    Types: Marijuana     Allergies   Ibuprofen   Review of Systems Review of Systems  Constitutional: Negative for chills, diaphoresis and fever.  HENT: Negative for congestion, ear pain and hearing  loss.   Eyes: Negative for visual disturbance.  Respiratory: Negative for cough and shortness of breath.   Cardiovascular: Negative for chest pain and palpitations.  Gastrointestinal: Negative for abdominal pain, diarrhea, nausea and vomiting.  Genitourinary: Negative for dysuria, frequency and hematuria.  Musculoskeletal: Negative for back pain, neck pain and neck stiffness.  Neurological: Positive for light-headedness and headaches. Negative for seizures, syncope, weakness and numbness.  Psychiatric/Behavioral: Negative for confusion.  All other systems reviewed and are negative.    Physical Exam Updated Vital Signs BP 115/75 (BP Location: Right Arm)   Pulse 100   Temp 98.8  F (37.1 C) (Oral)   Resp 18   Ht _0  (1.88 m)   Wt 113.4 kg   SpO2 97%   BMI 32.10 kg/m   Physical Exam Vitals signs and nursing note reviewed.  Constitutional:      General: He is not in acute distress.    Appearance: He is well-developed. He is not diaphoretic.  HENT:     Head: Normocephalic and atraumatic.     Right Ear: Tympanic membrane, ear canal and external ear normal.     Left Ear: Tympanic membrane, ear canal and external ear normal.     Nose: Nose normal.     Mouth/Throat:     Mouth: Mucous membranes are moist.     Pharynx: Oropharynx is clear.  Eyes:     Extraocular Movements: Extraocular movements intact.     Conjunctiva/sclera: Conjunctivae normal.     Pupils: Pupils are equal, round, and reactive to light.  Neck:     Musculoskeletal: Normal range of motion and neck supple.  Cardiovascular:     Rate and Rhythm: Normal rate and regular rhythm.     Pulses: Normal pulses.     Heart sounds: Normal heart sounds.  Pulmonary:     Effort: Pulmonary effort is normal. No respiratory distress.     Breath sounds: Normal breath sounds.  Abdominal:     Palpations: Abdomen is soft.     Tenderness: There is no abdominal tenderness. There is no guarding.  Musculoskeletal:     Right lower leg: No edema.     Left lower leg: No edema.     Comments: Normal motor function intact in all extremities. No midline spinal tenderness.   Lymphadenopathy:     Cervical: No cervical adenopathy.  Skin:    General: Skin is warm and dry.  Neurological:     Mental Status: He is alert and oriented to person, place, and time.     Comments: Sensation grossly intact to light touch in the extremities. No noted speech deficits. No aphasia. Patient handles oral secretions without difficulty. No noted swallowing defects.  Equal grip strength bilaterally. No arm drift. Strength 5/5 in the upper extremities. Strength 5/5 with flexion and extension of the hips, knees, and ankles bilaterally.    Negative Romberg. No gait disturbance.  Coordination intact including heel to shin and finger to nose.  Cranial nerves III-XII grossly intact.  No noted visual field deficit. No facial droop.   HINTS-Plus Exam Head Impulse: Abnormal, thus reassuring Nystagmus: Unidirectional, horizontal nystagmus noted. No bidirectional, rotation, or vertical nystagmus noted. Test of Skew: No abnormal skew Hearing: No noted acute hearing deficit with finger rub testing  Psychiatric:        Mood and Affect: Mood and affect normal.        Speech: Speech normal.        Behavior: Behavior normal.  ED Treatments / Results  Labs (all labs ordered are listed, but only abnormal results are displayed) Labs Reviewed  CBC WITH DIFFERENTIAL/PLATELET - Abnormal; Notable for the following components:      Result Value   WBC 11.9 (*)    Neutro Abs 8.7 (*)    Monocytes Absolute 1.1 (*)    All other components within normal limits  URINALYSIS, ROUTINE W REFLEX MICROSCOPIC - Abnormal; Notable for the following components:   Hgb urine dipstick SMALL (*)    All other components within normal limits  RAPID URINE DRUG SCREEN, HOSP PERFORMED - Abnormal; Notable for the following components:   Tetrahydrocannabinol POSITIVE (*)    All other components within normal limits  COMPREHENSIVE METABOLIC PANEL  ETHANOL    EKG EKG Interpretation  Date/Time:  Tuesday February 10 2019 14:51:14 EST Ventricular Rate:  73 PR Interval:    QRS Duration: 95 QT Interval:  348 QTC Calculation: 384 R Axis:   62 Text Interpretation:  Sinus rhythm Borderline T wave abnormalities since last tracing no significant change Confirmed by Daleen Bo [phone]) on 02/10/2019 3:12:44 PM   Radiology No results found.  Procedures Procedures (including critical care time)  Medications Ordered in ED Medications  sodium chloride 0.9 % bolus 1,000 mL (0 mLs Intravenous Stopped 02/10/19 1712)  dexamethasone (DECADRON) injection  10 mg (10 mg Intravenous Given 02/10/19 1452)  metoCLOPramide (REGLAN) injection 10 mg (10 mg Intravenous Given 02/10/19 1453)     Initial Impression / Assessment and Plan / ED Course  I have reviewed the triage vital signs and the nursing notes.  Pertinent labs & imaging results that were available during my care of the patient were reviewed by me and considered in my medical decision making (see chart for details).  Clinical Course as of Feb 10 1723  Tue Feb 10, 2019  1515 Patient states he feels much better.  He continues to deny dizziness, lightheadedness, or any other complaints.   [SJ]    Clinical Course User Index [SJ] Sricharan Lacomb C, PA-C    Patient presents with intermittent headache and lightheadedness.  The symptoms do not necessarily coincide. Patient is nontoxic appearing, afebrile, not tachycardic, not tachypneic, not hypotensive, maintains excellent SPO2 on room air, and is in no apparent distress.  No neurologic deficits.  Patient did not have recurrence of his symptoms here during his ED course. Patient has been under increased stress.  His headaches seem to be suggestive of tension headaches.  Another consideration is a side effect of his Biktarvy, which has a "common" adverse effect of headaches. The patient was given instructions for home care as well as return precautions. Patient voices understanding of these instructions, accepts the plan, and is comfortable with discharge.  Findings and plan of care discussed with Daleen Bo, MD.   Vitals:   02/10/19 1133 02/10/19 1452 02/10/19 1654  BP: 115/75 113/78 113/78  Pulse: 100 68 85  Resp: 18 (!) 22 17  Temp: 98.8 F (37.1 C)    TempSrc: Oral    SpO2: 97% 97% 97%  Weight: 113.4 kg    Height: _0  (1.88 m)      Orthostatic VS for the past 24 hrs:  BP- Lying Pulse- Lying BP- Sitting Pulse- Sitting BP- Standing at 0 minutes Pulse- Standing at 0 minutes  02/10/19 1644 105/64 59 113/74 74 106/78 77     Final  Clinical Impressions(s) / ED Diagnoses   Final diagnoses:  [place]    ED Discharge  Orders    None       Layla Maw 02/10/19 1730    Daleen Bo, MD 02/11/19 1538

## 2019-02-10 NOTE — ED Triage Notes (Signed)
Pt c/o dizziness for past 3 days when changes position and walks. Denies n/v/d or urinary problems.

## 2019-02-10 NOTE — Discharge Instructions (Addendum)
Headache Your lab results were reassuring.  For future headaches please try the following regimen: Acetaminophen: May take acetaminophen (generic for Tylenol), as needed, for pain. Your daily total maximum amount of acetaminophen from all sources should be limited to 4000mg /day for persons without liver problems, or 2000mg /day for those with liver problems.  Hydration: Have a goal of about a half liter of water every couple hours to stay well hydrated.   Sleep: Please be sure to get plenty of sleep with a goal of 8 hours per night. Having a regular bed time and bedtime routine can help with this.  Screens: Reduce the amount of time you are in front of screens.  Take about a 5-10-minute break every hour or every couple hours to give your eyes rest.  Do not use screens in dark rooms.  Glasses with a blue light filter may also help reduce eye fatigue.  Stress: Take steps to reduce stress as much as possible.   Follow up: Follow-up with your primary care provider on this issue.

## 2019-02-11 ENCOUNTER — Other Ambulatory Visit: Payer: Self-pay | Admitting: Pharmacist

## 2019-02-11 DIAGNOSIS — B20 Human immunodeficiency virus [HIV] disease: Secondary | ICD-10-CM

## 2019-03-09 ENCOUNTER — Ambulatory Visit: Payer: Self-pay | Admitting: Infectious Diseases

## 2019-03-09 NOTE — Progress Notes (Deleted)
Name: Maurice Young  DOB: 1991/01/07 MRN: [id number] PCP: Patient, No Pcp Per   Patient Active Problem List   Diagnosis Date Noted  . Insomnia 01/06/2019  . Healthcare maintenance 12/10/2018  . Adjustment disorder with mixed anxiety and depressed mood 12/10/2018  . HIV (human immunodeficiency virus infection) ([place]) 12/08/2018     Brief Narrative:  Maurice Young is a 28 y.o. male with HIV infection; diagnosed 09/2018 with CD4 nadir 570 VL 28,000 . History of OIs: none. HIV Risk: MSM.   Previous Regimens: . naive  Genotypes: . 10/12/18 - K103N (R-efavirenz and nevirapine)  Subjective:  CC:  HIV follow up. Some abdominal pain (3/10) with taking biktarvy.   HPI:   Interval history noted for ER visit 02/10/19 for dizziness and headaches. Neurologic exam normal aside from horizontal nystagmus. He was under increased stress at the time.   Review of Systems  Constitutional: Negative for chills, fever, malaise/fatigue and weight loss.  HENT: Negative for sore throat and tinnitus.        No dental problems  Eyes: Negative for blurred vision and photophobia.  Respiratory: Negative for cough and sputum production.   Cardiovascular: Negative for chest pain and leg swelling.  Gastrointestinal: Negative for abdominal pain, diarrhea, nausea and vomiting.  Genitourinary: Negative for dysuria and flank pain.  Musculoskeletal: Negative for joint pain, myalgias and neck pain.  Skin: Negative for rash.  Neurological: Negative for dizziness, tingling and headaches.  Psychiatric/Behavioral: Positive for depression and suicidal ideas. Negative for substance abuse. The patient is nervous/anxious and has insomnia.   All other systems reviewed and are negative.   Past Medical History:  Diagnosis Date  . Asthma   . HIV (human immunodeficiency virus infection) ([place]) 12/08/2018   Dx 09/2018. VL - ; CD4 nadir -  Genotype 09-2018 K103N  HLA B*5701 (-) Hep A Ab (-)  Hep B sAg (-)  Hep B sAb (-)   Hep B cAb (-)  Hep C Ab (-)  Quanitferon (-)      Outpatient Medications Prior to Visit  Medication Sig Dispense Refill  . bictegravir-emtricitabine-tenofovir AF (BIKTARVY) 50-200-25 MG TABS tablet Take 1 tablet by mouth daily. 30 tablet 10  . escitalopram (LEXAPRO) 10 MG tablet Take 1 tablet (10 mg total) by mouth daily. (Patient not taking: Reported on 02/10/2019) 30 tablet 3  . metoCLOPramide (REGLAN) 10 MG tablet Take 1 tablet (10 mg total) by mouth every 6 (six) hours as needed for nausea or vomiting. 30 tablet 0   No facility-administered medications prior to visit.      Allergies  Allergen Reactions  . Ibuprofen Anaphylaxis and Swelling    Social History   Tobacco Use  . Smoking status: Current Every Day Smoker    Packs/day: 0.50    Types: Cigarettes  . Smokeless tobacco: Never Used  Substance Use Topics  . Alcohol use: Yes    Alcohol/week: 2.0 standard drinks    Types: 2 Cans of beer per week    Comment: occasional/socially  . Drug use: Yes    Frequency: 7.0 times per week    Types: Marijuana    Social History   Substance and Sexual Activity  Sexual Activity Not Currently   Comment: declined condoms    Objective:   There were no vitals filed for this visit. There is no height or weight on file to calculate BMI.  Physical Exam Vitals signs reviewed.  Constitutional:      Appearance: He is well-developed.  Comments: Seated comfortably in chair during visit. Appears well.   HENT:     Mouth/Throat:     Dentition: Normal dentition. No dental abscesses.  Cardiovascular:     Rate and Rhythm: Normal rate and regular rhythm.     Heart sounds: Normal heart sounds.  Pulmonary:     Effort: Pulmonary effort is normal.     Breath sounds: Normal breath sounds.  Abdominal:     General: There is no distension.     Palpations: Abdomen is soft.     Tenderness: There is no abdominal tenderness.  Lymphadenopathy:     Cervical: No cervical adenopathy.  Skin:     General: Skin is warm and dry.     Findings: No rash.  Neurological:     Mental Status: He is alert and oriented to person, place, and time.  Psychiatric:        Judgment: Judgment normal.     Comments: Engaged in discussion. Tearful and emotionally distressed at times.     Lab Results Lab Results  Component Value Date   WBC 11.9 (H) 02/10/2019   HGB 15.0 02/10/2019   HCT 46.1 02/10/2019   MCV 95.4 02/10/2019   PLT 285 02/10/2019    Lab Results  Component Value Date   CREATININE 0.91 02/10/2019   BUN 11 02/10/2019   NA 137 02/10/2019   K 4.1 02/10/2019   CL 103 02/10/2019   CO2 26 02/10/2019    Lab Results  Component Value Date   ALT 12 02/10/2019   AST 21 02/10/2019   ALKPHOS 55 02/10/2019   BILITOT 1.0 02/10/2019    Lab Results  Component Value Date   CHOL 129 10/30/2018   HDL 36 (L) 10/30/2018   LDLCALC 61 10/30/2018   TRIG 271 (H) 10/30/2018   CHOLHDL 3.6 10/30/2018   HIV 1 RNA Quant (copies/mL)  Date Value  01/06/2019 78 (H)  10/30/2018 28,000 (H)   CD4 T Cell Abs (/uL)  Date Value  10/30/2018 570     Assessment & Plan:   Problem List Items Addressed This Visit    None     No follow-ups on file.   [name], MSN, NP-C [facility] for Infectious [place] Pager: [phone] Office: [phone]  03/09/19  8:45 AM

## 2019-03-10 ENCOUNTER — Encounter: Payer: Self-pay | Admitting: Infectious Diseases

## 2019-03-31 ENCOUNTER — Encounter: Payer: Self-pay | Admitting: Infectious Diseases

## 2019-03-31 ENCOUNTER — Ambulatory Visit (INDEPENDENT_AMBULATORY_CARE_PROVIDER_SITE_OTHER): Payer: Self-pay | Admitting: Infectious Diseases

## 2019-03-31 ENCOUNTER — Other Ambulatory Visit: Payer: Self-pay

## 2019-03-31 VITALS — BP 113/72 | HR 92 | Temp 99.5°F | Wt 251.0 lb

## 2019-03-31 DIAGNOSIS — Z0000 Encounter for general adult medical examination without abnormal findings: Secondary | ICD-10-CM

## 2019-03-31 DIAGNOSIS — Z21 Asymptomatic human immunodeficiency virus [HIV] infection status: Secondary | ICD-10-CM

## 2019-03-31 DIAGNOSIS — Z113 Encounter for screening for infections with a predominantly sexual mode of transmission: Secondary | ICD-10-CM

## 2019-03-31 DIAGNOSIS — Z23 Encounter for immunization: Secondary | ICD-10-CM

## 2019-03-31 NOTE — Progress Notes (Signed)
Name: Maurice Young  DOB: 27-Nov-1991 MRN: [id number] PCP: Patient, No Pcp Per   Patient Active Problem List   Diagnosis Date Noted  . Insomnia 01/06/2019  . Healthcare maintenance 12/10/2018  . Adjustment disorder with mixed anxiety and depressed mood 12/10/2018  . HIV (human immunodeficiency virus infection) ([place]) 12/08/2018     Brief Narrative:  Maurice Young is a 28 y.o. male with HIV infection; diagnosed 09/2018 with CD4 nadir 570 VL 28,000 . History of OIs: none. HIV Risk: MSM.   Previous Regimens: . Biktarvy >> suppressed  Genotypes: . 10/12/18 - K103N (R-efavirenz and nevirapine)  Subjective:  CC:  HIV follow up. Some abdominal pain (3/10) with taking biktarvy.   HPI:  Maurice Young is feeling well today.  He feels like he is finally adjusted to the Maurice Young and no longer is having any kind of side effects since our last discussions about taking this with food in the morning.  He has several questions about "HIV reinfection" as he exhibited open relationship with another HIV positive male who was not taking his medications. He has been sexually active since the last office visit without rectal exposure.   Review of Systems  Constitutional: Negative for chills, fever, malaise/fatigue and weight loss.  HENT: Negative for sore throat.        No dental problems  Respiratory: Negative for cough and sputum production.   Cardiovascular: Negative for chest pain and leg swelling.  Gastrointestinal: Negative for abdominal pain, diarrhea and vomiting.  Genitourinary: Negative for dysuria and flank pain.  Musculoskeletal: Negative for joint pain, myalgias and neck pain.  Skin: Negative for rash.  Neurological: Negative for dizziness, tingling and headaches.  Psychiatric/Behavioral: Negative for depression and substance abuse. The patient is not nervous/anxious and does not have insomnia.     Past Medical History:  Diagnosis Date  . Asthma   . HIV (human immunodeficiency virus  infection) ([place]) 12/08/2018   Dx 09/2018. VL - ; CD4 nadir -  Genotype 09-2018 K103N  HLA B*5701 (-) Hep A Ab (-)  Hep B sAg (-)  Hep B sAb (-)  Hep B cAb (-)  Hep C Ab (-)  Quanitferon (-)      Outpatient Medications Prior to Visit  Medication Sig Dispense Refill  . bictegravir-emtricitabine-tenofovir AF (BIKTARVY) 50-200-25 MG TABS tablet Take 1 tablet by mouth daily. 30 tablet 10  . metoCLOPramide (REGLAN) 10 MG tablet Take 1 tablet (10 mg total) by mouth every 6 (six) hours as needed for nausea or vomiting. 30 tablet 0  . escitalopram (LEXAPRO) 10 MG tablet Take 1 tablet (10 mg total) by mouth daily. (Patient not taking: Reported on 02/10/2019) 30 tablet 3   No facility-administered medications prior to visit.      Allergies  Allergen Reactions  . Ibuprofen Anaphylaxis and Swelling    Social History   Tobacco Use  . Smoking status: Current Every Day Smoker    Packs/day: 0.50    Types: Cigarettes  . Smokeless tobacco: Never Used  Substance Use Topics  . Alcohol use: Yes    Alcohol/week: 2.0 standard drinks    Types: 2 Cans of beer per week    Comment: occasional/socially  . Drug use: Yes    Frequency: 7.0 times per week    Types: Marijuana    Social History   Substance and Sexual Activity  Sexual Activity Not Currently   Comment: declined condoms    Objective:   Vitals:   03/31/19 1634  BP:  113/72  Pulse: 92  Temp: 99.5 F (37.5 C)  TempSrc: Oral  Weight: 251 lb (113.9 kg)   Body mass index is 32.23 kg/m.  Physical Exam Constitutional:      Appearance: He is well-developed.     Comments: Seated comfortably in chair during visit.   HENT:     Mouth/Throat:     Dentition: Normal dentition. No dental abscesses.  Cardiovascular:     Rate and Rhythm: Normal rate and regular rhythm.     Heart sounds: Normal heart sounds.  Pulmonary:     Effort: Pulmonary effort is normal.     Breath sounds: Normal breath sounds.  Abdominal:     General: There is no  distension.     Palpations: Abdomen is soft.     Tenderness: There is no abdominal tenderness.  Lymphadenopathy:     Cervical: No cervical adenopathy.  Skin:    General: Skin is warm and dry.     Findings: No rash.  Neurological:     Mental Status: He is alert and oriented to person, place, and time.  Psychiatric:        Judgment: Judgment normal.     Comments: In good spirits today and engaged in care discussion.      Lab Results Lab Results  Component Value Date   WBC 11.9 (H) 02/10/2019   HGB 15.0 02/10/2019   HCT 46.1 02/10/2019   MCV 95.4 02/10/2019   PLT 285 02/10/2019    Lab Results  Component Value Date   CREATININE 0.91 02/10/2019   BUN 11 02/10/2019   NA 137 02/10/2019   K 4.1 02/10/2019   CL 103 02/10/2019   CO2 26 02/10/2019    Lab Results  Component Value Date   ALT 12 02/10/2019   AST 21 02/10/2019   ALKPHOS 55 02/10/2019   BILITOT 1.0 02/10/2019    Lab Results  Component Value Date   CHOL 129 10/30/2018   HDL 36 (L) 10/30/2018   LDLCALC 61 10/30/2018   TRIG 271 (H) 10/30/2018   CHOLHDL 3.6 10/30/2018   HIV 1 RNA Quant (copies/mL)  Date Value  01/06/2019 78 (H)  10/30/2018 28,000 (H)   CD4 T Cell Abs (/uL)  Date Value  10/30/2018 570     Assessment & Plan:   Problem List Items Addressed This Visit      Unprioritized   HIV (human immunodeficiency virus infection) ([place])    We reviewed his previous and most recent lab values.  Viral load in October was 28,000 which reduced nicely to 78 copies in January.  He reports good adherence to his medication regimen with Biktarvy once daily.  We will repeat his viral load and CD4 count today.  CD4 nadir at entry to care was 570.  We did discuss U=U concept.  While it is very unlikely and probably low probability there has been evidence of HIV reinfection with a more resistant strain.  I reminded him that his Maurice Young does not prevent any STDs such as gonorrhea, chlamydia, syphilis and would recommend  good condom use.  We provided him condoms today.   Return in about 3 months (around 06/22/2019) for follow up.       Healthcare maintenance    He will receive Prevnar and third hepatitis B vaccine to complete the series in June.       Other Visit Diagnoses    Screening examination for venereal disease    -  Primary   Relevant Orders  Urine cytology ancillary only   Cytology (oral, anal, urethral) ancillary only   Human immunodeficiency virus (HIV) disease ([place])       Relevant Orders   T-helper cell (CD4)- ([facility] clinic only)   HIV-1 RNA quant-no reflex-bld   RPR   Pneumococcal conjugate vaccine 13-valent IM (Completed)   Need for vaccination against Streptococcus pneumoniae using pneumococcal conjugate vaccine 13       Relevant Orders   Pneumococcal conjugate vaccine 13-valent IM (Completed)     Return in about 3 months (around 06/22/2019) for follow up.   [name], MSN, NP-C [facility] for Infectious [place] Pager: [phone] Office: [phone]  04/01/19

## 2019-03-31 NOTE — Patient Instructions (Addendum)
Nice to see your doing well today!  Please continue your Biktarvy every day as you are currently taking.  We will call you with the results of your tests today if you need treatment, otherwise we will released to MyChart for your reference.  We will see you back in 3 months with labs 2 weeks before (can do labs with your visit for your 3rd hepatitis b vaccine in early June).

## 2019-04-01 NOTE — Assessment & Plan Note (Signed)
We reviewed his previous and most recent lab values.  Viral load in October was 28,000 which reduced nicely to 78 copies in January.  He reports good adherence to his medication regimen with Biktarvy once daily.  We will repeat his viral load and CD4 count today.  CD4 nadir at entry to care was 570.  We did discuss U=U concept.  While it is very unlikely and probably low probability there has been evidence of HIV reinfection with a more resistant strain.  I reminded him that his Susanne Borders does not prevent any STDs such as gonorrhea, chlamydia, syphilis and would recommend good condom use.  We provided him condoms today.   Return in about 3 months (around 06/22/2019) for follow up.

## 2019-04-01 NOTE — Assessment & Plan Note (Signed)
He will receive Prevnar and third hepatitis B vaccine to complete the series in June.

## 2019-04-02 LAB — URINE CYTOLOGY ANCILLARY ONLY
Chlamydia: NEGATIVE
Neisseria Gonorrhea: NEGATIVE

## 2019-04-02 LAB — CYTOLOGY, (ORAL, ANAL, URETHRAL) ANCILLARY ONLY
Chlamydia: NEGATIVE
Neisseria Gonorrhea: NEGATIVE

## 2019-06-10 ENCOUNTER — Ambulatory Visit: Payer: Self-pay

## 2019-06-10 ENCOUNTER — Other Ambulatory Visit: Payer: Self-pay

## 2019-06-23 ENCOUNTER — Telehealth: Payer: Self-pay | Admitting: Infectious Diseases

## 2019-06-23 NOTE — Telephone Encounter (Signed)
COVID-19 Pre-Screening Questions: ° °Do you currently have a fever (>100 °F), chills or unexplained body aches?no  ° °Are you currently experiencing new cough, shortness of breath, sore throat, runny nose? No  °•  °Have you recently travelled outside the state of [place] in the last 14 days? No   °•  °Have you been in contact with someone that is currently pending confirmation of Covid19 testing or has been confirmed to have the Covid19 virus? No  °

## 2019-06-24 ENCOUNTER — Encounter: Payer: Self-pay | Admitting: Infectious Diseases

## 2019-10-19 ENCOUNTER — Telehealth: Payer: Self-pay | Admitting: Pharmacy Technician

## 2019-10-19 NOTE — Telephone Encounter (Signed)
[facility] Patient Advocate Encounter  Received notification from [facility] Advancing Access that the patient would be removed from the program if they did not apply for adap or had an adap denial letter. Looks like the patient missed the last couple appointments and expired coverage on 09/30/2019 with [employer].

## 2020-02-25 ENCOUNTER — Other Ambulatory Visit: Payer: Self-pay

## 2020-02-25 ENCOUNTER — Ambulatory Visit: Payer: Self-pay

## 2020-02-25 DIAGNOSIS — Z21 Asymptomatic human immunodeficiency virus [HIV] infection status: Secondary | ICD-10-CM

## 2020-02-26 LAB — T-HELPER CELL (CD4) - (RCID CLINIC ONLY)
CD4 % Helper T Cell: 26 % — ABNORMAL LOW (ref 33–65)
CD4 T Cell Abs: 424 /uL (ref 400–1790)

## 2020-03-02 LAB — HIV-1 RNA QUANT-NO REFLEX-BLD
HIV 1 RNA Quant: 35300 copies/mL — ABNORMAL HIGH
HIV-1 RNA Quant, Log: 4.55 Log copies/mL — ABNORMAL HIGH

## 2020-03-02 LAB — RPR: RPR Ser Ql: NONREACTIVE

## 2020-03-10 ENCOUNTER — Telehealth: Payer: Self-pay

## 2020-03-10 NOTE — Telephone Encounter (Signed)
COVID-19 Pre-Screening Questions:03/10/20  Do you currently have a fever (>100 F), chills or unexplained body aches? NO  Are you currently experiencing new cough, shortness of breath, sore throat, runny nose? NO  .  Have you recently travelled outside the state of [place] in the last 14 days? NO .  Have you been in contact with someone that is currently pending confirmation of Covid19 testing or has been confirmed to have the Covid19 virus?  NO*   **If the patient answers NO to ALL questions -  advise the patient to please call the clinic before coming to the office should any symptoms develop.     

## 2020-03-11 ENCOUNTER — Ambulatory Visit (INDEPENDENT_AMBULATORY_CARE_PROVIDER_SITE_OTHER): Payer: Self-pay | Admitting: Infectious Diseases

## 2020-03-11 ENCOUNTER — Ambulatory Visit: Payer: Self-pay

## 2020-03-11 ENCOUNTER — Other Ambulatory Visit: Payer: Self-pay

## 2020-03-11 ENCOUNTER — Encounter: Payer: Self-pay | Admitting: Infectious Diseases

## 2020-03-11 DIAGNOSIS — B20 Human immunodeficiency virus [HIV] disease: Secondary | ICD-10-CM

## 2020-03-11 DIAGNOSIS — Z21 Asymptomatic human immunodeficiency virus [HIV] infection status: Secondary | ICD-10-CM

## 2020-03-11 MED ORDER — BIKTARVY 50-200-25 MG PO TABS: 1.0000 | ORAL_TABLET | Freq: Every day | ORAL | 5 refills | Status: DC

## 2020-03-11 NOTE — Patient Instructions (Addendum)
Nice to see you!  Please continue your [place] everyday like you are now.   We will get the process started to start you on Cabenuva injections for treatment every 30 days. Will notify you when we get everything ready to start!  Please schedule a follow up in 3 months just to ensure we have something on the books.

## 2020-03-11 NOTE — Progress Notes (Signed)
Name: Maurice Young  DOB: Feb 26, 1991 MRN: [id number] PCP: Patient, No Pcp Per    Patient Active Problem List   Diagnosis Date Noted  . HIV (human immunodeficiency virus infection) ([place]) 12/08/2018    Priority: High  . Insomnia 01/06/2019  . Healthcare maintenance 12/10/2018  . Adjustment disorder with mixed anxiety and depressed mood 12/10/2018     Brief Narrative:  Maurice Young is a 29 y.o. male with HIV infection; diagnosed 09/2018 with CD4 nadir 570 VL 28,000 . History of OIs: none. HIV Risk: MSM.   Previous Regimens: . Biktarvy >> suppressed  Genotypes: . 10/12/18 - K103N (R-efavirenz and nevirapine)  Subjective:  CC:  HIV follow up. Off meds about 3 months.     HPI:  He has been doing OK since our last office visit but intentionally took around 2-3 months off his medication. He describes that it was a lot of pressure and he hates that it is a life long and daily commitment/reminder. But he feels that this has helped him realize that he does indeed need to get back on track for his long term health. He does not have any depression necessarily - no difficulty sleeping or eating, no weight loss/changes.   Reports no complaints today suggestive of associated opportunistic infection or advancing HIV disease such as fevers, night sweats, weight loss, anorexia, cough, SOB, nausea, vomiting, diarrhea, headache, sensory changes, lymphadenopathy or oral thrush.     Review of Systems  Constitutional: Negative for chills, fever, malaise/fatigue and weight loss.  HENT: Negative for sore throat.        No dental problems  Respiratory: Negative for cough and sputum production.   Cardiovascular: Negative for chest pain and leg swelling.  Gastrointestinal: Negative for abdominal pain, diarrhea and vomiting.  Genitourinary: Negative for dysuria and flank pain.  Musculoskeletal: Negative for joint pain, myalgias and neck pain.  Skin: Negative for rash.  Neurological: Negative  for dizziness, tingling and headaches.  Psychiatric/Behavioral: Negative for depression and substance abuse. The patient is not nervous/anxious and does not have insomnia.     Past Medical History:  Diagnosis Date  . Asthma   . HIV (human immunodeficiency virus infection) ([place]) 12/08/2018   Dx 09/2018. VL - ; CD4 nadir -  Genotype 09-2018 K103N  HLA B*5701 (-) Hep A Ab (-)  Hep B sAg (-)  Hep B sAb (-)  Hep B cAb (-)  Hep C Ab (-)  Quanitferon (-)      Outpatient Medications Prior to Visit  Medication Sig Dispense Refill  . bictegravir-emtricitabine-tenofovir AF (BIKTARVY) 50-200-25 MG TABS tablet Take 1 tablet by mouth daily. 30 tablet 10  . metoCLOPramide (REGLAN) 10 MG tablet Take 1 tablet (10 mg total) by mouth every 6 (six) hours as needed for nausea or vomiting. (Patient not taking: Reported on 03/11/2020) 30 tablet 0   No facility-administered medications prior to visit.     Allergies  Allergen Reactions  . Ibuprofen Anaphylaxis and Swelling  . Nsaids Anaphylaxis    Social History   Tobacco Use  . Smoking status: Current Every Day Smoker    Packs/day: 0.50    Types: Cigarettes  . Smokeless tobacco: Never Used  Substance Use Topics  . Alcohol use: Yes    Alcohol/week: 2.0 standard drinks    Types: 2 Cans of beer per week    Comment: occasional/socially  . Drug use: Yes    Frequency: 7.0 times per week    Types: Marijuana  Social History   Substance and Sexual Activity  Sexual Activity Not Currently   Comment: declined condoms    Objective:   Vitals:   03/11/20 0945  BP: 116/75  Pulse: 87  Temp: 97.9 F (36.6 C)  TempSrc: Oral  Weight: 251 lb (113.9 kg)   Body mass index is 32.23 kg/m.  Physical Exam Constitutional:      Appearance: He is well-developed.     Comments: Seated comfortably in chair during visit.   HENT:     Mouth/Throat:     Dentition: Normal dentition. No dental abscesses.  Cardiovascular:     Rate and Rhythm: Normal rate and  regular rhythm.     Heart sounds: Normal heart sounds.  Pulmonary:     Effort: Pulmonary effort is normal.     Breath sounds: Normal breath sounds.  Abdominal:     General: There is no distension.     Palpations: Abdomen is soft.     Tenderness: There is no abdominal tenderness.  Lymphadenopathy:     Cervical: No cervical adenopathy.  Skin:    General: Skin is warm and dry.     Findings: No rash.  Neurological:     Mental Status: He is alert and oriented to person, place, and time.  Psychiatric:        Judgment: Judgment normal.     Comments: In good spirits today and engaged in care discussion.      Lab Results Lab Results  Component Value Date   WBC 11.9 (H) 02/10/2019   HGB 15.0 02/10/2019   HCT 46.1 02/10/2019   MCV 95.4 02/10/2019   PLT 285 02/10/2019    Lab Results  Component Value Date   CREATININE 0.91 02/10/2019   BUN 11 02/10/2019   NA 137 02/10/2019   K 4.1 02/10/2019   CL 103 02/10/2019   CO2 26 02/10/2019    Lab Results  Component Value Date   ALT 12 02/10/2019   AST 21 02/10/2019   ALKPHOS 55 02/10/2019   BILITOT 1.0 02/10/2019    Lab Results  Component Value Date   CHOL 129 10/30/2018   HDL 36 (L) 10/30/2018   LDLCALC 61 10/30/2018   TRIG 271 (H) 10/30/2018   CHOLHDL 3.6 10/30/2018   HIV 1 RNA Quant (copies/mL)  Date Value  02/25/2020 35,300 (H)  01/06/2019 78 (H)  10/30/2018 28,000 (H)   CD4 T Cell Abs (/uL)  Date Value  02/25/2020 424  10/30/2018 570     Assessment & Plan:   Problem List Items Addressed This Visit      High   HIV (human immunodeficiency virus infection) ([place])    Recently viral load nearly 30,000 - has been off medications for 3 months but ready to get back on track. He stopped his Biktarvy abruptly, therefore likely this will continue to work. Will have him resume once a day. Counseled on risks with interrupting ART therapy.  He is very interested in monthly Cabenuva injections - will have him sign consent  and forward to pharmacy team to see about prior authorization to obtain this for him.   Discussed oral lead in for 28d prior to injectable regimen - he is in agreement. Will have him back in 3 months       Relevant Medications   bictegravir-emtricitabine-tenofovir AF (BIKTARVY) 50-200-25 MG TABS tablet    Other Visit Diagnoses    Human immunodeficiency virus (HIV) disease ([place])       Relevant Medications  bictegravir-emtricitabine-tenofovir AF (BIKTARVY) 50-200-25 MG TABS tablet     Return in about 3 months (around 06/11/2020).   [name], MSN, NP-C [facility] for Infectious [place] Pager: [phone] Office: [phone]  03/13/20

## 2020-03-13 NOTE — Assessment & Plan Note (Signed)
Recently viral load nearly 30,000 - has been off medications for 3 months but ready to get back on track. He stopped his Biktarvy abruptly, therefore likely this will continue to work. Will have him resume once a day. Counseled on risks with interrupting ART therapy.  He is very interested in monthly Cabenuva injections - will have him sign consent and forward to pharmacy team to see about prior authorization to obtain this for him.   Discussed oral lead in for 28d prior to injectable regimen - he is in agreement. Will have him back in 3 months

## 2020-03-20 ENCOUNTER — Other Ambulatory Visit: Payer: Self-pay

## 2020-03-20 ENCOUNTER — Ambulatory Visit (HOSPITAL_COMMUNITY)
Admission: EM | Admit: 2020-03-20 | Discharge: 2020-03-20 | Disposition: A | Discharge status: Home / Self Care | Payer: Self-pay | Attending: Emergency Medicine | Admitting: Emergency Medicine

## 2020-03-20 ENCOUNTER — Encounter (HOSPITAL_COMMUNITY): Payer: Self-pay

## 2020-03-20 DIAGNOSIS — B029 Zoster without complications: Secondary | ICD-10-CM

## 2020-03-20 MED ORDER — TETRACAINE HCL 0.5 % OP SOLN
OPHTHALMIC | Status: AC
  Filled 2020-03-20: qty 4

## 2020-03-20 MED ORDER — VALACYCLOVIR HCL 1 G PO TABS: 1000.0000 mg | ORAL_TABLET | Freq: Three times a day (TID) | ORAL | 0 refills | Status: AC

## 2020-03-20 NOTE — ED Provider Notes (Signed)
[place]    CSN: [id number] Arrival date & time: 03/20/20  1719      History   Chief Complaint Chief Complaint  Patient presents with  . Rash    HPI Maurice Young is a 29 y.o. male history of HIV, asthma, presenting today for evaluation of a rash.  Patient notes that on Friday he began to develop some "scratches at the corner of his mouth and eye, over the past couple days it is turned into larger red bumps with associated pain.  Only noted on the left side of his face, denies any lesions on other parts of body or right side of face.  Has felt a little pressure behind eye, but denies any eye irritation pain or changes in vision.  He denies any fevers, does report chills yesterday.  Reports recently off HIV medicine for a few months, but has restarted this, recent viral load elevated.  HPI  Past Medical History:  Diagnosis Date  . Asthma   . HIV (human immunodeficiency virus infection) ([place]) 12/08/2018   Dx 09/2018. VL - ; CD4 nadir -  Genotype 09-2018 K103N  HLA B*5701 (-) Hep A Ab (-)  Hep B sAg (-)  Hep B sAb (-)  Hep B cAb (-)  Hep C Ab (-)  Quanitferon (-)      Patient Active Problem List   Diagnosis Date Noted  . Insomnia 01/06/2019  . Healthcare maintenance 12/10/2018  . Adjustment disorder with mixed anxiety and depressed mood 12/10/2018  . HIV (human immunodeficiency virus infection) ([place]) 12/08/2018    Past Surgical History:  Procedure Laterality Date  . NO PAST SURGERIES         Home Medications    Prior to Admission medications   Medication Sig Start Date End Date Taking? Authorizing Provider  bictegravir-emtricitabine-tenofovir AF (BIKTARVY) 50-200-25 MG TABS tablet Take 1 tablet by mouth daily. 03/11/20  Yes Mineral Callas, NP  valACYclovir (VALTREX) 1000 MG tablet Take 1 tablet (1,000 mg total) by mouth 3 (three) times daily for 14 days. 03/20/20 04/03/20  Meliya Mcconahy C, PA-C  metoCLOPramide (REGLAN) 10 MG tablet Take 1 tablet (10 mg  total) by mouth every 6 (six) hours as needed for nausea or vomiting. Patient not taking: Reported on 03/11/2020 12/25/18 03/20/20  [name], NP    Family History Family History  Problem Relation Age of Onset  . Healthy Mother   . Healthy Father   . Diabetes Other        "both sides of the family, not sure who"   . Cancer Other     Social History Social History   Tobacco Use  . Smoking status: Current Every Day Smoker    Packs/day: 0.50    Types: Cigarettes  . Smokeless tobacco: Never Used  Substance Use Topics  . Alcohol use: Yes    Alcohol/week: 2.0 standard drinks    Types: 2 Cans of beer per week    Comment: occasional/socially  . Drug use: Yes    Frequency: 7.0 times per week    Types: Marijuana     Allergies   Ibuprofen and Nsaids   Review of Systems Review of Systems  Constitutional: Negative for fatigue and fever.  Eyes: Negative for redness, itching and visual disturbance.  Respiratory: Negative for shortness of breath.   Cardiovascular: Negative for chest pain and leg swelling.  Gastrointestinal: Negative for nausea and vomiting.  Musculoskeletal: Negative for arthralgias and myalgias.  Skin: Positive for  color change and rash. Negative for wound.  Neurological: Negative for dizziness, syncope, weakness, light-headedness and headaches.     Physical Exam Triage Vital Signs ED Triage Vitals  Enc Vitals Group     BP 03/20/20 1738 106/73     Pulse Rate 03/20/20 1738 84     Resp 03/20/20 1738 18     Temp 03/20/20 1738 98.7 F (37.1 C)     Temp Source 03/20/20 1738 Oral     SpO2 03/20/20 1738 97 %     Weight --      Height --      Head Circumference --      Peak Flow --      Pain Score 03/20/20 1735 7     Pain Loc --      Pain Edu? --      Excl. in [place]? --    No data found.  Updated Vital Signs BP 106/73 (BP Location: Left Arm)   Pulse 84   Temp 98.7 F (37.1 C) (Oral)   Resp 18   SpO2 97%   Visual Acuity Right Eye Distance:    Left Eye Distance:   Bilateral Distance:    Right Eye Near:   Left Eye Near:    Bilateral Near:     Physical Exam Vitals and nursing note reviewed.  Constitutional:      Appearance: He is well-developed.     Comments: No acute distress  HENT:     Head: Normocephalic and atraumatic.     Ears:     Comments: Left TM appears erythematous, but with good cone of light to superior portion of TM compared to right.  Nonbulging,    Nose: Nose normal.     Mouth/Throat:     Comments: Oral mucosa pink and moist, no tonsillar enlargement or exudate. Posterior pharynx patent and nonerythematous, no uvula deviation or swelling. Normal phonation. No lesions on oral mucosa Eyes:     Extraocular Movements: Extraocular movements intact.     Pupils: Pupils are equal, round, and reactive to light.     Comments: Left eye appears faintly more injected compared to right, no photophobia with exam, anterior chamber clear, limbus clear, no dendritic lesions or ulcers noted with fluorescein staining  Cardiovascular:     Rate and Rhythm: Normal rate.  Pulmonary:     Effort: Pulmonary effort is normal. No respiratory distress.  Abdominal:     General: There is no distension.  Musculoskeletal:        General: Normal range of motion.     Cervical back: Neck supple.  Skin:    General: Skin is warm and dry.     Comments: Erythematous rash noted to left side of face and periocular and perioral area, small vesicular lesions noted within rash, extends into lateral canthus of eye  Neurological:     Mental Status: He is alert and oriented to person, place, and time.          UC Treatments / Results  Labs (all labs ordered are listed, but only abnormal results are displayed) Labs Reviewed - No data to display  EKG   Radiology No results found.  Procedures Procedures (including critical care time)  Medications Ordered in UC Medications - No data to display  Initial Impression / Assessment and  Plan / UC Course  I have reviewed the triage vital signs and the nursing notes.  Pertinent labs & imaging results that were available during my care of the  patient were reviewed by me and considered in my medical decision making (see chart for details).     Rash is concerning for shingles.  Initiating on Valtrex 3 times daily for the next 1 to 2 weeks.  No obvious signs of ocular involvement at this time, but will still have follow-up with ophthalmology given proximity.  Provided contact information for Dr. Posey Pronto who is on-call.  Provided education on shingles.  Discussed strict return precautions. Patient verbalized understanding and is agreeable with plan.  Final Clinical Impressions(s) / UC Diagnoses   Final diagnoses:  Herpes zoster without complication     Discharge Instructions     Begin Valtrex 3 times daily for the next 10 to 14 days Tylenol for pain Please follow-up with ophthalmology to ensure no eye involvement or if symptoms progressing entirely   ED Prescriptions    Medication Sig Dispense Auth. Provider   valACYclovir (VALTREX) 1000 MG tablet Take 1 tablet (1,000 mg total) by mouth 3 (three) times daily for 14 days. 42 tablet Banesa Tristan, [place] C, PA-C     PDMP not reviewed this encounter.   Janith Lima, [place] 03/20/20 1818

## 2020-03-20 NOTE — Discharge Instructions (Signed)
Begin Valtrex 3 times daily for the next 10 to 14 days Tylenol for pain Please follow-up with ophthalmology to ensure no eye involvement or if symptoms progressing entirely

## 2020-03-20 NOTE — ED Triage Notes (Signed)
Pt c/o painful rash to left side of face. Pt reports started as small "scratches" at corner mouth, along left cheek, near left eye.  Defined red circular with mild vesicular areas to left side of face, adjacent to left eye and into left scalp/hairline and border of left upper pinna. Pt also c/o pain "behind left eye".  Denies fever.  Reports feeling "chills" yesterday.  Pt uncertain if he has ever had chicken pox.

## 2020-03-21 ENCOUNTER — Telehealth (HOSPITAL_COMMUNITY): Payer: Self-pay | Admitting: Internal Medicine

## 2020-03-21 MED ORDER — ACYCLOVIR 800 MG PO TABS: 400.0000 mg | ORAL_TABLET | Freq: Every day | ORAL | 0 refills | Status: AC

## 2020-03-21 NOTE — Telephone Encounter (Signed)
Patient comes to urgent care this morning requesting medications to be changed because he could not afford it.  Patient was prescribed Valtrex for presumed varicella-zoster infection.  I will change patient's prescription to acyclovir.  This is 80% cheaper than the original prescription.

## 2020-06-07 ENCOUNTER — Telehealth: Payer: Self-pay | Admitting: Pharmacist

## 2020-06-07 NOTE — Telephone Encounter (Signed)
Faxed patient's Cabenuva enrollment form to [employer] today. They will assess patient's insurance and send a benefits investigation form detailing how to start the injections (where to get it from and cost). Once benefits investigation is complete, we will order oral lead-in therapy and start patient on treatment. Will update encounter with further details when available. 

## 2020-06-09 ENCOUNTER — Ambulatory Visit: Payer: Self-pay | Admitting: Infectious Diseases

## 2020-10-19 ENCOUNTER — Telehealth: Payer: Self-pay

## 2020-10-19 NOTE — Telephone Encounter (Signed)
My Chart Cabenuva Message  

## 2020-12-14 ENCOUNTER — Telehealth: Payer: Self-pay | Admitting: Pharmacist

## 2020-12-14 NOTE — Telephone Encounter (Signed)
Called patient to discuss Cabenuva. His ADAP has lapsed and he will need to renew before starting. No answer, no VM.

## 2021-07-13 ENCOUNTER — Ambulatory Visit (INDEPENDENT_AMBULATORY_CARE_PROVIDER_SITE_OTHER): Payer: Self-pay | Admitting: Family

## 2021-07-13 ENCOUNTER — Encounter: Payer: Self-pay | Admitting: Family

## 2021-07-13 ENCOUNTER — Other Ambulatory Visit: Payer: Self-pay

## 2021-07-13 VITALS — BP 130/76 | HR 95 | Temp 98.8°F | Wt 242.0 lb

## 2021-07-13 DIAGNOSIS — Z113 Encounter for screening for infections with a predominantly sexual mode of transmission: Secondary | ICD-10-CM

## 2021-07-13 DIAGNOSIS — Z79899 Other long term (current) drug therapy: Secondary | ICD-10-CM

## 2021-07-13 DIAGNOSIS — Z21 Asymptomatic human immunodeficiency virus [HIV] infection status: Secondary | ICD-10-CM

## 2021-07-13 DIAGNOSIS — Z0000 Encounter for general adult medical examination without abnormal findings: Secondary | ICD-10-CM

## 2021-07-13 NOTE — Assessment & Plan Note (Addendum)
Maurice Young has poorly controlled virus as he has been off medications since before his last office visit. No current signs/symptoms of opportunistic infection. We discussed treatment options including daily medications and monthly injectable medication. We discussed that natural remedies would not be beneficial.  Unfortunately at this time I do not feel he would be a good candidate for monthly injections secondary to his lack of follow ups although it would certainly help him with taking medication. We discussed starting Biktarvy when ready with some means of helping him get started. Samples provided and logged in pharmacy records. Check lab work. Renew financial assistance. Plan for follow up in 1 month or sooner if needed.

## 2021-07-13 NOTE — Progress Notes (Signed)
Patient ID: Maurice Young, male    DOB: April 06, 1991, 30 y.o.   MRN: [id number]  Subjective:    Chief Complaint  Patient presents with   Follow-up   HPI:  Maurice Young is a 30 y.o. male with HIV disease last seen by [name], NP on 03/11/20 having intentionally taken 2-3 months off medication secondary to it being a lot of pressure and commitment. Viral load at the time was 35,300 with CD4 count of 424. Has been out of care since that time. Here today for follow up.   [name] did not restart medication following his last appointment as he continued to have challenges with taking pills on a daily basis which reminds him of his current medical condition. He attempted at one point to fill the medication however he had challenges with his financial assistance. Overall feeling well today. Has several questions about HIV care and whether natural remedies like juicing would work or if injectable medications would be an option. Denies fevers, chills, night sweats, headaches, changes in vision, neck pain/stiffness, nausea, diarrhea, vomiting, lesions or rashes.  [name] currently has no insurance and will need to apply for financial assistance through [facility]. He struggles with depression at times related to his current situation. No suicidal ideations. Drinks alcohol on occasion and smoke marijuana. Smokes about 0.5 packs of cigarettes per day. Not currently sexually active and declines condoms.    Allergies  Allergen Reactions   Ibuprofen Anaphylaxis and Swelling   Nsaids Anaphylaxis      Outpatient Medications Prior to Visit  Medication Sig Dispense Refill   bictegravir-emtricitabine-tenofovir AF (BIKTARVY) 50-200-25 MG TABS tablet Take 1 tablet by mouth daily. 30 tablet 5   No facility-administered medications prior to visit.     Past Medical History:  Diagnosis Date   Asthma    HIV (human immunodeficiency virus infection) ([place]) 12/08/2018   Dx 09/2018. VL - ;  CD4 nadir -  Genotype 09-2018 K103N  HLA B*5701 (-) Hep A Ab (-)  Hep B sAg (-)  Hep B sAb (-)  Hep B cAb (-)  Hep C Ab (-)  Quanitferon (-)       Past Surgical History:  Procedure Laterality Date   NO PAST SURGERIES       Review of Systems  Constitutional:  Negative for appetite change, chills, fatigue, fever and unexpected weight change.  Eyes:  Negative for visual disturbance.  Respiratory:  Negative for cough, chest tightness, shortness of breath and wheezing.   Cardiovascular:  Negative for chest pain and leg swelling.  Gastrointestinal:  Negative for abdominal pain, constipation, diarrhea, nausea and vomiting.  Genitourinary:  Negative for dysuria, flank pain, frequency, genital sores, hematuria and urgency.  Skin:  Negative for rash.  Allergic/Immunologic: Negative for immunocompromised state.  Neurological:  Negative for dizziness and headaches.     Objective:    BP 130/76   Pulse 95   Temp 98.8 F (37.1 C) (Oral)   Wt 242 lb (109.8 kg)   SpO2 99%   BMI 31.07 kg/m  Nursing note and vital signs reviewed.  Physical Exam Constitutional:      General: He is not in acute distress.    Appearance: He is well-developed.  Eyes:     Conjunctiva/sclera: Conjunctivae normal.  Cardiovascular:     Rate and Rhythm: Normal rate and regular rhythm.     Heart sounds: Normal heart sounds. No murmur heard.   No friction rub. No gallop.  Pulmonary:     Effort: Pulmonary effort is normal. No respiratory distress.     Breath sounds: Normal breath sounds. No wheezing or rales.  Chest:     Chest wall: No tenderness.  Abdominal:     General: Bowel sounds are normal.     Palpations: Abdomen is soft.     Tenderness: There is no abdominal tenderness.  Musculoskeletal:     Cervical back: Neck supple.  Lymphadenopathy:     Cervical: No cervical adenopathy.  Skin:    General: Skin is warm and dry.     Findings: No rash.  Neurological:     Mental Status: He is alert and oriented to  person, place, and time.  Psychiatric:        Behavior: Behavior normal.        Thought Content: Thought content normal.        Judgment: Judgment normal.     Depression screen PHQ 2/9 12/09/2018  Decreased Interest 1  Down, Depressed, Hopeless 1  PHQ - 2 Score 2  Altered sleeping 1  Tired, decreased energy 1  Change in appetite 2  Feeling bad or failure about yourself  1  Trouble concentrating 1  Moving slowly or fidgety/restless 1  Suicidal thoughts 1  PHQ-9 Score 10  Difficult doing work/chores Somewhat difficult       Assessment & Plan:    Patient Active Problem List   Diagnosis Date Noted   Insomnia 01/06/2019   Healthcare maintenance 12/10/2018   Adjustment disorder with mixed anxiety and depressed mood 12/10/2018   HIV (human immunodeficiency virus infection) ([place]) 12/08/2018     Problem List Items Addressed This Visit       Other   HIV (human immunodeficiency virus infection) ([place]) - Primary    Mr. Vanoverbeke has poorly controlled virus as he has been off medications since before his last office visit. No current signs/symptoms of opportunistic infection. We discussed treatment options including daily medications and monthly injectable medication. We discussed that natural remedies would not be beneficial.  Unfortunately at this time I do not feel he would be a good candidate for monthly injections secondary to his lack of follow ups although it would certainly help him with taking medication. We discussed starting Biktarvy when ready with some means of helping him get started. Samples provided and logged in pharmacy records. Check lab work. Renew financial assistance. Plan for follow up in 1 month or sooner if needed.        Relevant Orders   HIV-1 RNA ultraquant reflex to gentyp+   T-helper cell (CD4)- ([facility] clinic only)   COMPLETE METABOLIC PANEL WITH GFR   Healthcare maintenance   Other Visit Diagnoses     Pharmacologic therapy       Screening for STDs  (sexually transmitted diseases)       Relevant Orders   RPR        I am having Rodd Goleman maintain his [employer].   I spent 34 minutes face to face with Mr. Rolfson, over half in discussion of HIV diagnosis, risks if left untreated, treatment options and assistance with medication adherence.   Follow-up: Return in about 1 month (around 08/13/2021), or if symptoms worsen or fail to improve.   Terri Piedra, MSN, FNP-C Nurse Practitioner [facility] [place] number: [phone]

## 2021-07-13 NOTE — Patient Instructions (Signed)
Nice to see you.   We will check your lab work to determine your current levels.   Start taking your medication once daily.  Let us know if you have any questions.   Plan for follow up in 1 month or sooner if needed.  Have a great day and stay safe.

## 2021-07-14 ENCOUNTER — Other Ambulatory Visit: Payer: Self-pay

## 2021-07-14 DIAGNOSIS — Z21 Asymptomatic human immunodeficiency virus [HIV] infection status: Secondary | ICD-10-CM

## 2021-07-14 DIAGNOSIS — Z113 Encounter for screening for infections with a predominantly sexual mode of transmission: Secondary | ICD-10-CM

## 2021-07-18 ENCOUNTER — Other Ambulatory Visit (HOSPITAL_COMMUNITY): Payer: Self-pay

## 2021-07-18 ENCOUNTER — Telehealth: Payer: Self-pay

## 2021-07-18 NOTE — Telephone Encounter (Signed)
Medication Samples have been provided to the patient.  Drug name: Biktarvy        Strength: 50/200/25 mg       Qty: 28  (4 bottles)  LOT: [id number]   Exp.Date: 06/24  Dosing instructions: Take one tablet by mouth once daily  Clearance Coots, CPhT Specialty Pharmacy Patient [facility] Phone: [phone] Fax:  [phone]

## 2021-07-21 LAB — COMPLETE METABOLIC PANEL WITH GFR
AG Ratio: 1.3 (calc) (ref 1.0–2.5)
ALT: 14 U/L (ref 9–46)
AST: 17 U/L (ref 10–40)
Albumin: 4.2 g/dL (ref 3.6–5.1)
Alkaline phosphatase (APISO): 69 U/L (ref 36–130)
BUN: 15 mg/dL (ref 7–25)
CO2: 28 mmol/L (ref 20–32)
Calcium: 9.4 mg/dL (ref 8.6–10.3)
Chloride: 105 mmol/L (ref 98–110)
Creat: 0.93 mg/dL (ref 0.60–1.24)
Globulin: 3.2 g/dL (calc) (ref 1.9–3.7)
Glucose, Bld: 99 mg/dL (ref 65–99)
Potassium: 4.3 mmol/L (ref 3.5–5.3)
Sodium: 138 mmol/L (ref 135–146)
Total Bilirubin: 0.6 mg/dL (ref 0.2–1.2)
Total Protein: 7.4 g/dL (ref 6.1–8.1)
eGFR: 114 mL/min/{1.73_m2} (ref >=60)

## 2021-07-21 LAB — RPR: RPR Ser Ql: NONREACTIVE

## 2021-07-21 LAB — T-HELPER CELLS (CD4) COUNT (NOT AT ARMC)
Absolute CD4: 485 cells/uL — ABNORMAL LOW (ref 490–1740)
CD4 T Helper %: 23 % — ABNORMAL LOW (ref 30–61)
Total lymphocyte count: 2090 cells/uL (ref 850–3900)

## 2021-07-21 LAB — HIV-1 GENOTYPE: HIV-1 Genotype: DETECTED — AB

## 2021-07-21 LAB — HIV-1 RNA ULTRAQUANT REFLEX TO GENTYP+
HIV 1 RNA Quant: 13400 copies/mL — ABNORMAL HIGH
HIV-1 RNA Quant, Log: 4.13 Log copies/mL — ABNORMAL HIGH

## 2021-08-14 ENCOUNTER — Ambulatory Visit: Payer: Self-pay | Admitting: Family

## 2021-08-18 ENCOUNTER — Ambulatory Visit: Payer: Self-pay | Admitting: Family

## 2021-09-05 ENCOUNTER — Ambulatory Visit: Payer: Self-pay | Admitting: Family

## 2021-10-23 ENCOUNTER — Telehealth: Payer: Self-pay

## 2021-10-23 NOTE — Telephone Encounter (Signed)
Called patient to schedule with pharmacy, multiple no shows. Patient did not answer, unable to leave voicemail.   Sandie Ano, RN

## 2022-01-24 ENCOUNTER — Telehealth: Payer: Self-pay

## 2022-01-24 NOTE — Telephone Encounter (Signed)
Called patient to offer appointment, no answer an unable to leave voicemail.    Beryle Flock, RN

## 2022-02-24 ENCOUNTER — Emergency Department (HOSPITAL_COMMUNITY)
Admission: EM | Admit: 2022-02-24 | Discharge: 2022-02-24 | Disposition: A | Discharge status: Home / Self Care | Payer: Self-pay | Attending: Emergency Medicine | Admitting: Emergency Medicine

## 2022-02-24 ENCOUNTER — Emergency Department (HOSPITAL_COMMUNITY): Payer: Self-pay

## 2022-02-24 ENCOUNTER — Other Ambulatory Visit: Payer: Self-pay

## 2022-02-24 ENCOUNTER — Encounter (HOSPITAL_COMMUNITY): Payer: Self-pay | Admitting: Emergency Medicine

## 2022-02-24 DIAGNOSIS — S62326A Displaced fracture of shaft of fifth metacarpal bone, right hand, initial encounter for closed fracture: Secondary | ICD-10-CM | POA: Insufficient documentation

## 2022-02-24 DIAGNOSIS — W228XXA Striking against or struck by other objects, initial encounter: Secondary | ICD-10-CM | POA: Insufficient documentation

## 2022-02-24 DIAGNOSIS — S62339A Displaced fracture of neck of unspecified metacarpal bone, initial encounter for closed fracture: Secondary | ICD-10-CM

## 2022-02-24 MED ORDER — HYDROCODONE-ACETAMINOPHEN 5-325 MG PO TABS: 1.0000 | ORAL_TABLET | ORAL | 0 refills | Status: DC | PRN

## 2022-02-24 MED ORDER — ACETAMINOPHEN 500 MG PO TABS
1000.0000 mg | ORAL_TABLET | Freq: Once | ORAL | Status: AC
  Administered 2022-02-24: 1000 mg via ORAL
  Filled 2022-02-24: qty 2

## 2022-02-24 NOTE — ED Triage Notes (Signed)
Pt reported to ED with pain to right hand after punching wall this evening. States he feels some tingling, able to extend fingers.

## 2022-02-24 NOTE — ED Notes (Signed)
RN reviewed discharge instructions with pt. Pt verbalized understanding and had no further questions. VSS upon discharge.  

## 2022-02-24 NOTE — Discharge Instructions (Signed)
Take the prescribed medication as directed.  Do not drive while taking this.  Be careful taking extra tylenol with this medication. Follow-up with Dr. Izora Ribas-- call his office Monday to get appt scheduled. Return to the ED for new or worsening symptoms.

## 2022-02-24 NOTE — Progress Notes (Signed)
Orthopedic Tech Progress Note Patient Details:  Maurice Young 01/02/1991 [id number]  Ortho Devices Type of Ortho Device: Ulna gutter splint Ortho Device/Splint Location: RUE Ortho Device/Splint Interventions: Ordered, Application, Adjustment   Post Interventions Patient Tolerated: Well Instructions Provided: Care of device, Poper ambulation with device  Taraneh Metheney 02/24/2022, 5:17 AM

## 2022-02-24 NOTE — ED Notes (Signed)
Patient transported to X-ray 

## 2022-02-24 NOTE — ED Provider Notes (Signed)
[facility] EMERGENCY DEPARTMENT Provider Note   CSN: [id number] Arrival date & time: 02/24/22  0211     History  Chief Complaint  Patient presents with   Hand Pain   Hand Injury    Maurice Young is a 31 y.o. male.  The history is provided by the patient and medical records.   31 year old male presenting to the ED with right hand pain after he punched a wall.  Pain along the fifth metacarpal.  States fingers feel little tingly but is able to move them.  No intervention tried prior to arrival.  He is right hand dominant.  Home Medications Prior to Admission medications   Medication Sig Start Date End Date Taking? Authorizing Provider  HYDROcodone-acetaminophen (NORCO/VICODIN) 5-325 MG tablet Take 1 tablet by mouth every 4 (four) hours as needed. 02/24/22  Yes Garlon Hatchet, PA-C  bictegravir-emtricitabine-tenofovir AF (BIKTARVY) 50-200-25 MG TABS tablet Take 1 tablet by mouth daily. 03/11/20   Blanchard Kelch, NP  metoCLOPramide (REGLAN) 10 MG tablet Take 1 tablet (10 mg total) by mouth every 6 (six) hours as needed for nausea or vomiting. Patient not taking: Reported on 03/11/2020 12/25/18 03/20/20  Blanchard Kelch, NP      Allergies    Ibuprofen and Nsaids    Review of Systems   Review of Systems  Musculoskeletal:  Positive for arthralgias.  All other systems reviewed and are negative.  Physical Exam Updated Vital Signs BP 128/80    Pulse (!) 108    Temp 98.5 F (36.9 C) (Oral)    Resp 16    SpO2 97%   Physical Exam Vitals and nursing note reviewed.  Constitutional:      Appearance: He is well-developed.  HENT:     Head: Normocephalic and atraumatic.  Eyes:     Conjunctiva/sclera: Conjunctivae normal.     Pupils: Pupils are equal, round, and reactive to light.  Cardiovascular:     Rate and Rhythm: Normal rate and regular rhythm.     Heart sounds: Normal heart sounds.  Pulmonary:     Effort: Pulmonary effort is normal.     Breath sounds:  Normal breath sounds.  Abdominal:     General: Bowel sounds are normal.     Palpations: Abdomen is soft.  Musculoskeletal:        General: Normal range of motion.     Cervical back: Normal range of motion.     Comments: Right hand with swelling and tenderness along the fifth metacarpal, radial pulse intact, normal sensation to fingers, normal cap refill  Skin:    General: Skin is warm and dry.  Neurological:     Mental Status: He is alert and oriented to person, place, and time.    ED Results / Procedures / Treatments   Labs (all labs ordered are listed, but only abnormal results are displayed) Labs Reviewed - No data to display  EKG None  Radiology DG Hand Complete Right  Result Date: 02/24/2022 CLINICAL DATA:  Punched a wall EXAM: RIGHT HAND - COMPLETE 3+ VIEW COMPARISON:  None. FINDINGS: Comminuted fracture of the distal shaft of the fifth metacarpal with apex dorsal and medial angulation. No dislocation or opaque foreign body. IMPRESSION: Angulated fifth metacarpal shaft fracture. Electronically Signed   By: Tiburcio Pea M.D.   On: 02/24/2022 04:08    Procedures Procedures    Medications Ordered in ED Medications  acetaminophen (TYLENOL) tablet 1,000 mg (1,000 mg Oral Given 02/24/22 0514)  ED Course/ Medical Decision Making/ A&P                           Medical Decision Making Amount and/or Complexity of Data Reviewed Radiology: ordered.  Risk OTC drugs. Prescription drug management.   31 year old male here with right hand pain after punching a wall.  Does have swelling and tenderness along the right fifth metacarpal.  Hand is neurovascularly intact.  X-ray confirms boxer's fracture.  Will place in ulnar gutter splint and refer to hand surgery for follow-up.  Can return here for new concerns.  Final Clinical Impression(s) / ED Diagnoses Final diagnoses:  Closed boxer's fracture, initial encounter    Rx / DC Orders ED Discharge Orders          Ordered     HYDROcodone-acetaminophen (NORCO/VICODIN) 5-325 MG tablet  Every 4 hours PRN        02/24/22 0415              Garlon Hatchet, PA-C 02/24/22 0532    Mesner, Barbara Cower, MD 02/24/22 [phone]

## 2022-05-16 ENCOUNTER — Ambulatory Visit: Payer: Self-pay

## 2022-05-16 ENCOUNTER — Ambulatory Visit (INDEPENDENT_AMBULATORY_CARE_PROVIDER_SITE_OTHER): Payer: Self-pay | Admitting: Family

## 2022-05-16 ENCOUNTER — Encounter: Payer: Self-pay | Admitting: Family

## 2022-05-16 ENCOUNTER — Other Ambulatory Visit: Payer: Self-pay

## 2022-05-16 VITALS — BP 113/72 | HR 100 | Resp 16 | Ht 74.0 in | Wt 242.0 lb

## 2022-05-16 DIAGNOSIS — Z21 Asymptomatic human immunodeficiency virus [HIV] infection status: Secondary | ICD-10-CM

## 2022-05-16 DIAGNOSIS — Z113 Encounter for screening for infections with a predominantly sexual mode of transmission: Secondary | ICD-10-CM

## 2022-05-16 DIAGNOSIS — Z0000 Encounter for general adult medical examination without abnormal findings: Secondary | ICD-10-CM

## 2022-05-16 DIAGNOSIS — F4323 Adjustment disorder with mixed anxiety and depressed mood: Secondary | ICD-10-CM

## 2022-05-16 MED ORDER — BIKTARVY 50-200-25 MG PO TABS: 1.0000 | ORAL_TABLET | Freq: Every day | ORAL | 0 refills | Status: DC

## 2022-05-16 NOTE — Assessment & Plan Note (Signed)
Anurag returns today with poorly controlled virus secondary t being off medication for the past 9 months due to depression that is now improving and wanting to re-engage in care. Reviewed previous lab work and discussed resources at his disposal to help with depression including counseling. Check lab work today. Financial assistance renewed with [employer] pending. Samples of Biktarvy provided and recorded in pharmacy record. Plan for follow up in 1 month or sooner if needed.  ?

## 2022-05-16 NOTE — Patient Instructions (Signed)
Nice to see you. ? ?We will check your lab work today. ? ?Samples of medication will be provided.  ? ?Plan for follow up in 1 months or sooner if needed with lab work on the same day. ? ?Have a great day and stay safe! ? ?

## 2022-05-16 NOTE — Assessment & Plan Note (Signed)
Previously with exacerbation of depression and now in a better spot. Discussed availability of counseling if interested. No medications indicated at this time. No suicidal ideation or signs of psychosis.  ?

## 2022-05-16 NOTE — Progress Notes (Signed)
? ? ?Brief Narrative  ? ?Patient ID: Maurice Young, male    DOB: April 07, 1991, 31 y.o.   MRN: [id number] ? ?Maurice Young is a 31 y/o AA male diagnosed with HIV infection in on 09/2018 with initial CD4 count of 570 and viral load 28,000. Genotype with K103N (R-efavirenz and nevirapine). No history of opportunistic infection. Maurice Young negative Entered care at [facility] Stage 1.Sole ART regimen of Biktarvy.  ? ?Subjective:  ?  ?Chief Complaint  ?Patient presents with  ? Follow-up  ? ? ?HPI: ? ?Maurice Young is a 31 y.o. male with HIV disease last seen on  07/13/21 with poorly controlled virus secondary to less than optimal adherence to his ART regimen of Biktarvy. Viral load was 13,400 and CD4 count 485. Genotype with similar K103N mutation. RPR was non-reactive, and kidney function, liver function and electrolytes were within normal ranges. Here today for follow up. ? ?Maurice Young returns today following a 10 month absence secondary to exacerbation of his depression. He has since been in a better place and is ready to get his health right. Has not been on medication for about 9 months. Feeling well today with no new concerns/complaints. Denies fevers, chills, night sweats, headaches, changes in vision, neck pain/stiffness, nausea, diarrhea, vomiting, lesions or rashes. ? ?Maurice Young renewed his financial assistance. Does continue to have some symptoms of depression but continually getting better with no suicidal ideation. Drinks alcohol socially. Marijuana use most days and tobacco daily. Condoms offered.  ? ? ?Allergies  ?Allergen Reactions  ? Ibuprofen Anaphylaxis and Swelling  ? Nsaids Anaphylaxis  ? ? ? ? ?Outpatient Medications Prior to Visit  ?Medication Sig Dispense Refill  ? HYDROcodone-acetaminophen (NORCO/VICODIN) 5-325 MG tablet Take 1 tablet by mouth every 4 (four) hours as needed. 20 tablet 0  ? bictegravir-emtricitabine-tenofovir AF (BIKTARVY) 50-200-25 MG TABS tablet Take 1 tablet by mouth daily. 30 tablet 5  ? ?No  facility-administered medications prior to visit.  ? ? ? ?Past Medical History:  ?Diagnosis Date  ? Asthma   ? HIV (human immunodeficiency virus infection) ([place]) 12/08/2018  ? Dx 09/2018. VL - ; CD4 nadir -  Genotype 09-2018 K103N  HLA B*5701 (-) Hep A Ab (-)  Hep B sAg (-)  Hep B sAb (-)  Hep B cAb (-)  Hep C Ab (-)  Quanitferon (-)    ? ? ? ?Past Surgical History:  ?Procedure Laterality Date  ? NO PAST SURGERIES    ? ? ? ? ?Review of Systems  ?Constitutional:  Negative for appetite change, chills, fatigue, fever and unexpected weight change.  ?Eyes:  Negative for visual disturbance.  ?Respiratory:  Negative for cough, chest tightness, shortness of breath and wheezing.   ?Cardiovascular:  Negative for chest pain and leg swelling.  ?Gastrointestinal:  Negative for abdominal pain, constipation, diarrhea, nausea and vomiting.  ?Genitourinary:  Negative for dysuria, flank pain, frequency, genital sores, hematuria and urgency.  ?Skin:  Negative for rash.  ?Allergic/Immunologic: Negative for immunocompromised state.  ?Neurological:  Negative for dizziness and headaches.  ?   ?Objective:  ?  ?BP 113/72   Pulse 100   Resp 16   Ht 6' 2"  (1.88 m)   Wt 242 lb (109.8 kg)   SpO2 (!) 80%   BMI 31.07 kg/m?  ?Nursing note and vital signs reviewed. ? ?Physical Exam ?Constitutional:   ?   General: He is not in acute distress. ?   Appearance: He is well-developed.  ?Eyes:  ?   Conjunctiva/sclera: Conjunctivae normal.  ?  Cardiovascular:  ?   Rate and Rhythm: Normal rate and regular rhythm.  ?   Heart sounds: Normal heart sounds. No murmur heard. ?  No friction rub. No gallop.  ?Pulmonary:  ?   Effort: Pulmonary effort is normal. No respiratory distress.  ?   Breath sounds: Normal breath sounds. No wheezing or rales.  ?Chest:  ?   Chest wall: No tenderness.  ?Abdominal:  ?   General: Bowel sounds are normal.  ?   Palpations: Abdomen is soft.  ?   Tenderness: There is no abdominal tenderness.  ?Musculoskeletal:  ?   Cervical back:  Neck supple.  ?Lymphadenopathy:  ?   Cervical: No cervical adenopathy.  ?Skin: ?   General: Skin is warm and dry.  ?   Findings: No rash.  ?Neurological:  ?   Mental Status: He is alert and oriented to person, place, and time.  ?Psychiatric:     ?   Behavior: Behavior normal.     ?   Thought Content: Thought content normal.     ?   Judgment: Judgment normal.  ? ? ? ? ?  05/16/2022  ?  1:59 PM 12/09/2018  ? 10:33 AM  ?Depression screen PHQ 2/9  ?Decreased Interest 0 1  ?Down, Depressed, Hopeless 0 1  ?PHQ - 2 Score 0 2  ?Altered sleeping  1  ?Tired, decreased energy  1  ?Change in appetite  2  ?Feeling bad or failure about yourself   1  ?Trouble concentrating  1  ?Moving slowly or fidgety/restless  1  ?Suicidal thoughts  1  ?PHQ-9 Score  10  ?Difficult doing work/chores  Somewhat difficult  ?  ?   ?Assessment & Plan:  ? ? ?Patient Active Problem List  ? Diagnosis Date Noted  ? Insomnia 01/06/2019  ? Healthcare maintenance 12/10/2018  ? Adjustment disorder with mixed anxiety and depressed mood 12/10/2018  ? HIV (human immunodeficiency virus infection) ([place]) 12/08/2018  ? ? ? ?Problem List Items Addressed This Visit   ? ?  ? Other  ? HIV (human immunodeficiency virus infection) ([place]) - Primary  ?  Maurice Young returns today with poorly controlled virus secondary t being off medication for the past 9 months due to depression that is now improving and wanting to re-engage in care. Reviewed previous lab work and discussed resources at his disposal to help with depression including counseling. Check lab work today. Financial assistance renewed with [employer]MAP pending. Samples of Biktarvy provided and recorded in pharmacy record. Plan for follow up in 1 month or sooner if needed.  ? ?  ?  ? Relevant Medications  ? bictegravir-emtricitabine-tenofovir AF (BIKTARVY) 50-200-25 MG TABS tablet  ? Other Relevant Orders  ? Comprehensive metabolic panel  ? HIV RNA, RTPCR W/R GT (RTI, PI,INT)  ? T-helper cell (CD4)- ([facility] clinic only)  ?  Healthcare maintenance  ?  Discussed importance of safe sexual practice and condom use. Condoms offered.  ?Declines vaccines.  ?  ?  ? Adjustment disorder with mixed anxiety and depressed mood  ?  Previously with exacerbation of depression and now in a better spot. Discussed availability of counseling if interested. No medications indicated at this time. No suicidal ideation or signs of psychosis.  ? ?  ?  ? ?Other Visit Diagnoses   ? ? Screening for STDs (sexually transmitted diseases)      ? Relevant Orders  ? RPR  ? ?  ? ? ? ?I am having Maurice Young maintain  his HYDROcodone-acetaminophen and Biktarvy. ? ? ?Meds ordered this encounter  ?Medications  ? bictegravir-emtricitabine-tenofovir AF (BIKTARVY) 50-200-25 MG TABS tablet  ?  Sig: Take 1 tablet by mouth daily.  ?  Dispense:  28 tablet  ?  Refill:  0  ?  Order Specific Question:   Supervising Provider  ?  Answer:   Carlyle Basques [[id number]]  ? ? ? ?Follow-up: Return in about 1 month (around 06/16/2022), or if symptoms worsen or fail to improve. ? ? ?Terri Piedra, MSN, FNP-C ?Nurse Practitioner ?[place] [facility] ?[facility] ?[facility] Main number: [phone] ? ? ?

## 2022-05-16 NOTE — Assessment & Plan Note (Signed)
?   Discussed importance of safe sexual practice and condom use.  Condoms offered.  Declines vaccines. 

## 2022-05-18 ENCOUNTER — Other Ambulatory Visit: Payer: Self-pay | Admitting: Pharmacist

## 2022-05-18 DIAGNOSIS — Z21 Asymptomatic human immunodeficiency virus [HIV] infection status: Secondary | ICD-10-CM

## 2022-05-18 LAB — T-HELPER CELL (CD4) - (RCID CLINIC ONLY)
CD4 % Helper T Cell: 17 % — ABNORMAL LOW (ref 33–65)
CD4 T Cell Abs: 293 /uL — ABNORMAL LOW (ref 400–1790)

## 2022-05-18 MED ORDER — BICTEGRAVIR-EMTRICITAB-TENOFOV 50-200-25 MG PO TABS: 1.0000 | ORAL_TABLET | Freq: Every day | ORAL | 0 refills | Status: AC

## 2022-05-18 NOTE — Progress Notes (Signed)
Medication Samples have been provided to the patient.  Drug name: Biktarvy        Strength: 50/200/25 mg       Qty: 28 tablets (4 bottles) LOT: CMWKWA   Exp.Date: 9/25  Dosing instructions: Take one tablet by mouth once daily  The patient has been instructed regarding the correct time, dose, and frequency of taking this medication, including desired effects and most common side effects.   Becket Wecker, PharmD, CPP Clinical Pharmacist Practitioner Infectious Diseases Clinical Pharmacist [facility]  

## 2022-05-23 ENCOUNTER — Other Ambulatory Visit: Payer: Self-pay

## 2022-05-23 ENCOUNTER — Encounter (HOSPITAL_COMMUNITY): Payer: Self-pay

## 2022-05-23 ENCOUNTER — Emergency Department (HOSPITAL_COMMUNITY): Payer: Self-pay

## 2022-05-23 ENCOUNTER — Emergency Department (HOSPITAL_COMMUNITY)
Admission: EM | Admit: 2022-05-23 | Discharge: 2022-05-23 | Disposition: A | Discharge status: Home / Self Care | Payer: Self-pay | Attending: Emergency Medicine | Admitting: Emergency Medicine

## 2022-05-23 DIAGNOSIS — R072 Precordial pain: Secondary | ICD-10-CM | POA: Insufficient documentation

## 2022-05-23 DIAGNOSIS — R059 Cough, unspecified: Secondary | ICD-10-CM | POA: Insufficient documentation

## 2022-05-23 LAB — CBC
HCT: 45.7 % (ref 39.0–52.0)
Hemoglobin: 15.4 g/dL (ref 13.0–17.0)
MCH: 31.8 pg (ref 26.0–34.0)
MCHC: 33.7 g/dL (ref 30.0–36.0)
MCV: 94.2 fL (ref 80.0–100.0)
Platelets: 249 10*3/uL (ref 150–400)
RBC: 4.85 MIL/uL (ref 4.22–5.81)
RDW: 11.7 % (ref 11.5–15.5)
WBC: 4.7 10*3/uL (ref 4.0–10.5)
nRBC: 0 % (ref 0.0–0.2)

## 2022-05-23 LAB — BASIC METABOLIC PANEL
Anion gap: 8 (ref 5–15)
BUN: 11 mg/dL (ref 6–20)
CO2: 23 mmol/L (ref 22–32)
Calcium: 9 mg/dL (ref 8.9–10.3)
Chloride: 108 mmol/L (ref 98–111)
Creatinine, Ser: 0.86 mg/dL (ref 0.61–1.24)
GFR, Estimated: >60 mL/min (ref >60)
Glucose, Bld: 95 mg/dL (ref 70–99)
Potassium: 4.2 mmol/L (ref 3.5–5.1)
Sodium: 139 mmol/L (ref 135–145)

## 2022-05-23 LAB — TROPONIN I (HIGH SENSITIVITY): Troponin I (High Sensitivity): 3 ng/L (ref <18)

## 2022-05-23 MED ORDER — ACETAMINOPHEN 500 MG PO TABS
1000.0000 mg | ORAL_TABLET | Freq: Once | ORAL | Status: AC
  Administered 2022-05-23: 1000 mg via ORAL
  Filled 2022-05-23: qty 2

## 2022-05-23 NOTE — ED Notes (Signed)
Pt arrives to room, ambulatory with no difficulty with complaints of left sided chest pain with a cough and radiation to left back for 3 days. Pt denies fever, chills, n/v/d.

## 2022-05-23 NOTE — ED Triage Notes (Signed)
Pt arrived POV from home c/o left sided chest pain that radiates to his back that started 3 days ago. Pt also endorses SHOB.

## 2022-05-23 NOTE — ED Provider Notes (Signed)
[place] EMERGENCY DEPARTMENT Provider Note   CSN: [id number] Arrival date & time: 05/23/22  1101     History  Chief Complaint  Patient presents with   Chest Pain    Maurice Young is a 31 y.o. male.  Pt c/o pain to mid to left chest pain past three days. Symptoms occur w occasional non prod cough, and certain movements/positional changes. No associated sob, nv or diaphoresis. No constant and/or pleuritic pain. No sore throat. No fever or chills. No hx cad, or fam hx cad. No hx dvt or pe. No leg pain or swelling. No heartburn. No abd pain or nv.   The history is provided by the patient and medical records.  Chest Pain Associated symptoms: cough   Associated symptoms: no abdominal pain, no back pain, no fever, no headache, no nausea, no palpitations, no shortness of breath and no vomiting       Home Medications Prior to Admission medications   Medication Sig Start Date End Date Taking? Authorizing Provider  bictegravir-emtricitabine-tenofovir AF (BIKTARVY) 50-200-25 MG TABS tablet Take 1 tablet by mouth daily. 05/16/22   Golden Circle, FNP  bictegravir-emtricitabine-tenofovir AF (BIKTARVY) 50-200-25 MG TABS tablet Take 1 tablet by mouth daily for 28 days. 05/16/22 06/13/22  Esmond Plants, RPH-CPP  HYDROcodone-acetaminophen (NORCO/VICODIN) 5-325 MG tablet Take 1 tablet by mouth every 4 (four) hours as needed. 02/24/22   Larene Pickett, PA-C  metoCLOPramide (REGLAN) 10 MG tablet Take 1 tablet (10 mg total) by mouth every 6 (six) hours as needed for nausea or vomiting. Patient not taking: Reported on 03/11/2020 12/25/18 03/20/20  Georgetown Callas, NP      Allergies    Ibuprofen and Nsaids    Review of Systems   Review of Systems  Constitutional:  Negative for fever.  HENT:  Negative for sore throat.   Eyes:  Negative for redness.  Respiratory:  Positive for cough. Negative for shortness of breath.   Cardiovascular:  Positive for chest pain. Negative for  palpitations and leg swelling.  Gastrointestinal:  Negative for abdominal pain, nausea and vomiting.  Genitourinary:  Negative for flank pain.  Musculoskeletal:  Negative for back pain and neck pain.  Skin:  Negative for rash.  Neurological:  Negative for headaches.  Hematological:  Does not bruise/bleed easily.  Psychiatric/Behavioral:  Negative for confusion.    Physical Exam Updated Vital Signs BP 123/80 (BP Location: Right Arm) Comment: Simultaneous filing. User may not have seen previous data. Comment (BP Location): Simultaneous filing. User may not have seen previous data.  Pulse 89 Comment: Simultaneous filing. User may not have seen previous data.  Temp 98.9 F (37.2 C) (Oral) Comment: Simultaneous filing. User may not have seen previous data. Comment (Src): Simultaneous filing. User may not have seen previous data.  Resp 20 Comment: Simultaneous filing. User may not have seen previous data.  Ht 1.88 m (6\' 2" )   Wt 111.1 kg   SpO2 100% Comment: Simultaneous filing. User may not have seen previous data.  BMI 31.46 kg/m  Physical Exam Vitals and nursing note reviewed.  Constitutional:      Appearance: Normal appearance. He is well-developed.  HENT:     Head: Atraumatic.     Nose: Nose normal.     Mouth/Throat:     Mouth: Mucous membranes are moist.     Pharynx: Oropharynx is clear.  Eyes:     General: No scleral icterus.    Conjunctiva/sclera: Conjunctivae normal.  Neck:  Trachea: No tracheal deviation.  Cardiovascular:     Rate and Rhythm: Normal rate and regular rhythm.     Pulses: Normal pulses.     Heart sounds: Normal heart sounds. No murmur heard.   No friction rub. No gallop.  Pulmonary:     Effort: Pulmonary effort is normal. No accessory muscle usage or respiratory distress.     Breath sounds: Normal breath sounds.  Abdominal:     General: Bowel sounds are normal. There is no distension.     Palpations: Abdomen is soft.     Tenderness: There is no  abdominal tenderness.  Genitourinary:    Comments: No cva tenderness. Musculoskeletal:        General: No swelling or tenderness.     Cervical back: Normal range of motion and neck supple. No rigidity.     Right lower leg: No edema.     Left lower leg: No edema.  Skin:    General: Skin is warm and dry.     Findings: No rash.  Neurological:     Mental Status: He is alert.     Comments: Alert, speech clear.   Psychiatric:        Mood and Affect: Mood normal.    ED Results / Procedures / Treatments   Labs (all labs ordered are listed, but only abnormal results are displayed) Results for orders placed or performed during the hospital encounter of 05/23/22  Basic metabolic panel  Result Value Ref Range   Sodium 139 135 - 145 mmol/L   Potassium 4.2 3.5 - 5.1 mmol/L   Chloride 108 98 - 111 mmol/L   CO2 23 22 - 32 mmol/L   Glucose, Bld 95 70 - 99 mg/dL   BUN 11 6 - 20 mg/dL   Creatinine, Ser 5.36 0.61 - 1.24 mg/dL   Calcium 9.0 8.9 - 14.4 mg/dL   GFR, Estimated >31 >54 mL/min   Anion gap 8 5 - 15  CBC  Result Value Ref Range   WBC 4.7 4.0 - 10.5 K/uL   RBC 4.85 4.22 - 5.81 MIL/uL   Hemoglobin 15.4 13.0 - 17.0 g/dL   HCT 00.8 67.6 - 19.5 %   MCV 94.2 80.0 - 100.0 fL   MCH 31.8 26.0 - 34.0 pg   MCHC 33.7 30.0 - 36.0 g/dL   RDW 09.3 26.7 - 12.4 %   Platelets 249 150 - 400 K/uL   nRBC 0.0 0.0 - 0.2 %  Troponin I (High Sensitivity)  Result Value Ref Range   Troponin I (High Sensitivity) 3 <18 ng/L   DG Chest 2 View  Result Date: 05/23/2022 CLINICAL DATA:  Sharp mid chest pain radiating to LEFT side, shortness of breath, and lightheadedness for 3 days. History of asthma and smoking EXAM: CHEST - 2 VIEW COMPARISON:  11/02/2017 FINDINGS: Normal heart size, mediastinal contours, and pulmonary vascularity. Lungs clear. No pulmonary infiltrate, pleural effusion, or pneumothorax. Eventration of LEFT diaphragm unchanged. No acute osseous findings. IMPRESSION: No acute abnormalities.  Electronically Signed   By: Ulyses Southward M.D.   On: 05/23/2022 11:36      EKG EKG Interpretation  Date/Time:  Wednesday May 23 2022 11:07:09 EDT Ventricular Rate:  92 PR Interval:  156 QRS Duration: 82 QT Interval:  334 QTC Calculation: 413 R Axis:   47 Text Interpretation: Normal sinus rhythm Nonspecific T wave abnormality Confirmed by Cathren Laine ([id number]) on 05/23/2022 11:48:26 AM  Radiology DG Chest 2 View  Result Date: 05/23/2022 CLINICAL  DATA:  Sharp mid chest pain radiating to LEFT side, shortness of breath, and lightheadedness for 3 days. History of asthma and smoking EXAM: CHEST - 2 VIEW COMPARISON:  11/02/2017 FINDINGS: Normal heart size, mediastinal contours, and pulmonary vascularity. Lungs clear. No pulmonary infiltrate, pleural effusion, or pneumothorax. Eventration of LEFT diaphragm unchanged. No acute osseous findings. IMPRESSION: No acute abnormalities. Electronically Signed   By: Lavonia Dana M.D.   On: 05/23/2022 11:36    Procedures Procedures    Medications Ordered in ED Medications  acetaminophen (TYLENOL) tablet 1,000 mg (has no administration in time range)    ED Course/ Medical Decision Making/ A&P                           Medical Decision Making Problems Addressed: Precordial chest pain: acute illness or injury with systemic symptoms that poses a threat to life or bodily functions  Amount and/or Complexity of Data Reviewed External Data Reviewed: notes. Labs: ordered. Decision-making details documented in ED Course. Radiology: ordered and independent interpretation performed. Decision-making details documented in ED Course. ECG/medicine tests: ordered and independent interpretation performed. Decision-making details documented in ED Course.  Risk OTC drugs. Decision regarding hospitalization.   Iv ns. Continuous pulse ox and cardiac monitoring. Labs ordered/sent. Imaging ordered.   Differential dx includes acs, stemi, gerd, msk pain, etc.  Disposition decision including potential need for admission considered - will get labs and imaging and  reassess.   Reviewed nursing notes and prior charts for additional history. External reports reviewed.   Cardiac monitor: sinus rhythm, rate 88.  Labs reviewed/interpreted by me - wbc and hgb normal. Trop normal.  Xrays reviewed/interpreted by me - no pna.   No meds today or pta.   Acetaminophen po.  Pt noted w reproduction symptoms with certain movements/position changes or occasional non prod cough - appears most c/w msk pain.   Rec pcp f/u.  Return precautions provided.            Final Clinical Impression(s) / ED Diagnoses Final diagnoses:  None    Rx / DC Orders ED Discharge Orders     None         Lajean Saver, MD 05/23/22 1245

## 2022-05-23 NOTE — Discharge Instructions (Addendum)
It was our pleasure to provide your ER care today - we hope that you feel better.  Overall, your ED tests look good.  Take acetaminophen as need.   Follow up with primary care doctor in one week if symptoms fail to improve/resolve.  Return to ER if worse, new symptoms, recurrent/persistent chest pain, increased trouble breathing, or other concern.

## 2022-05-24 LAB — COMPREHENSIVE METABOLIC PANEL
AG Ratio: 1.3 (calc) (ref 1.0–2.5)
ALT: 14 U/L (ref 9–46)
AST: 18 U/L (ref 10–40)
Albumin: 4.3 g/dL (ref 3.6–5.1)
Alkaline phosphatase (APISO): 80 U/L (ref 36–130)
BUN: 12 mg/dL (ref 7–25)
CO2: 27 mmol/L (ref 20–32)
Calcium: 9.6 mg/dL (ref 8.6–10.3)
Chloride: 104 mmol/L (ref 98–110)
Creat: 0.89 mg/dL (ref 0.60–1.26)
Globulin: 3.4 g/dL (calc) (ref 1.9–3.7)
Glucose, Bld: 88 mg/dL (ref 65–99)
Potassium: 4 mmol/L (ref 3.5–5.3)
Sodium: 139 mmol/L (ref 135–146)
Total Bilirubin: 0.5 mg/dL (ref 0.2–1.2)
Total Protein: 7.7 g/dL (ref 6.1–8.1)

## 2022-05-24 LAB — HIV RNA, RTPCR W/R GT (RTI, PI,INT)

## 2022-05-24 LAB — RPR: RPR Ser Ql: NONREACTIVE

## 2022-06-18 ENCOUNTER — Other Ambulatory Visit: Payer: Self-pay

## 2022-06-18 ENCOUNTER — Encounter: Payer: Self-pay | Admitting: Family

## 2022-06-18 ENCOUNTER — Ambulatory Visit (INDEPENDENT_AMBULATORY_CARE_PROVIDER_SITE_OTHER): Payer: Self-pay | Admitting: Family

## 2022-06-18 ENCOUNTER — Telehealth: Payer: Self-pay

## 2022-06-18 VITALS — BP 125/81 | HR 100 | Temp 98.4°F | Wt 246.0 lb

## 2022-06-18 DIAGNOSIS — Z0000 Encounter for general adult medical examination without abnormal findings: Secondary | ICD-10-CM

## 2022-06-18 DIAGNOSIS — Z21 Asymptomatic human immunodeficiency virus [HIV] infection status: Secondary | ICD-10-CM

## 2022-06-18 MED ORDER — BIKTARVY 50-200-25 MG PO TABS: 1.0000 | ORAL_TABLET | Freq: Every day | ORAL | 3 refills | Status: DC

## 2022-06-18 NOTE — Assessment & Plan Note (Signed)
   Discussed importance of safe sexual practice and condom use. Condoms offered.   Declines vaccinations. Due for pneumococcal, meningococcal and tetanus.

## 2022-06-18 NOTE — Patient Instructions (Signed)
Nice to see you.  We will check your lab work today.  Continue to take your medication daily as prescribed.  Refills have been sent to the pharmacy.  Plan for follow up in 2 months or sooner if needed with lab work on the same day.  Have a great day and stay safe!  

## 2022-06-18 NOTE — Progress Notes (Signed)
Brief Narrative   Patient ID: Maurice Young, male    DOB: June 08, 1991, 31 y.o.   MRN: [id number]  Maurice Young is a 31 y/o AA male diagnosed with HIV infection in on 09/2018 with initial CD4 count of 570 and viral load 28,000. Genotype with K103N (R-efavirenz and nevirapine). No history of opportunistic infection. [id number] negative Entered care at [facility] Stage 1.Sole ART regimen of Biktarvy.   Subjective:    Chief Complaint  Patient presents with   Follow-up    HPI:  Maurice Young is a 31 y.o. male with HIV disease last seen on 05/16/22 with poorly controlled virus secondary to being off medication. Viral load was unable to be completed with CD4 count of 293. Kidney function, liver function and electrolytes were within normal ranges. Here today for follow up.  Maurice Young has been taking his Biktarvy as prescribed with no adverse side effects or missed doses. Feeling well today with no new concerns complaints. Denies fevers, chills, night sweats, headaches, changes in vision, neck pain/stiffness, nausea, diarrhea, vomiting, lesions or rashes.  No problems obtaining medication and has been approved for [employer]. Will need to renew after July 1st. Denies feelings of being down, depressed, or hopeless. Drinks alcohol socially with daily marijuana use and everyday tobacco use. Condoms offered. Healthcare maintenance due includes pneumococcal, meningococcal, and tetanus vaccinations.    Allergies  Allergen Reactions   Ibuprofen Anaphylaxis and Swelling   Nsaids Anaphylaxis      Outpatient Medications Prior to Visit  Medication Sig Dispense Refill   HYDROcodone-acetaminophen (NORCO/VICODIN) 5-325 MG tablet Take 1 tablet by mouth every 4 (four) hours as needed. 20 tablet 0   bictegravir-emtricitabine-tenofovir AF (BIKTARVY) 50-200-25 MG TABS tablet Take 1 tablet by mouth daily. 28 tablet 0   No facility-administered medications prior to visit.     Past Medical History:  Diagnosis Date    Asthma    HIV (human immunodeficiency virus infection) ([place]) 12/08/2018   Dx 09/2018. VL - ; CD4 nadir -  Genotype 09-2018 K103N  HLA B*5701 (-) Hep A Ab (-)  Hep B sAg (-)  Hep B sAb (-)  Hep B cAb (-)  Hep C Ab (-)  Quanitferon (-)       Past Surgical History:  Procedure Laterality Date   NO PAST SURGERIES        Review of Systems  Constitutional:  Negative for appetite change, chills, fatigue, fever and unexpected weight change.  Eyes:  Negative for visual disturbance.  Respiratory:  Negative for cough, chest tightness, shortness of breath and wheezing.   Cardiovascular:  Negative for chest pain and leg swelling.  Gastrointestinal:  Negative for abdominal pain, constipation, diarrhea, nausea and vomiting.  Genitourinary:  Negative for dysuria, flank pain, frequency, genital sores, hematuria and urgency.  Skin:  Negative for rash.  Allergic/Immunologic: Negative for immunocompromised state.  Neurological:  Negative for dizziness and headaches.      Objective:    BP 125/81   Pulse 100   Temp 98.4 F (36.9 C) (Oral)   Wt 246 lb (111.6 kg)   SpO2 95%   BMI 31.58 kg/m  Nursing note and vital signs reviewed.  Physical Exam Constitutional:      General: He is not in acute distress.    Appearance: He is well-developed.  Eyes:     Conjunctiva/sclera: Conjunctivae normal.  Cardiovascular:     Rate and Rhythm: Normal rate and regular rhythm.     Heart sounds: Normal heart sounds.  No murmur heard.    No friction rub. No gallop.  Pulmonary:     Effort: Pulmonary effort is normal. No respiratory distress.     Breath sounds: Normal breath sounds. No wheezing or rales.  Chest:     Chest wall: No tenderness.  Abdominal:     General: Bowel sounds are normal.     Palpations: Abdomen is soft.     Tenderness: There is no abdominal tenderness.  Musculoskeletal:     Cervical back: Neck supple.  Lymphadenopathy:     Cervical: No cervical adenopathy.  Skin:    General: Skin is  warm and dry.     Findings: No rash.  Neurological:     Mental Status: He is alert and oriented to person, place, and time.  Psychiatric:        Behavior: Behavior normal.        Thought Content: Thought content normal.        Judgment: Judgment normal.         05/16/2022    1:59 PM 12/09/2018   10:33 AM  Depression screen PHQ 2/9  Decreased Interest 0 1  Down, Depressed, Hopeless 0 1  PHQ - 2 Score 0 2  Altered sleeping  1  Tired, decreased energy  1  Change in appetite  2  Feeling bad or failure about yourself   1  Trouble concentrating  1  Moving slowly or fidgety/restless  1  Suicidal thoughts  1  PHQ-9 Score  10  Difficult doing work/chores  Somewhat difficult       Assessment & Plan:    Patient Active Problem List   Diagnosis Date Noted   Insomnia 01/06/2019   Healthcare maintenance 12/10/2018   Adjustment disorder with mixed anxiety and depressed mood 12/10/2018   HIV (human immunodeficiency virus infection) ([place]) 12/08/2018     Problem List Items Addressed This Visit       Other   HIV (human immunodeficiency virus infection) ([place]) - Primary    Maurice Young has improved adherence and good tolerance to Biktarvy. Reviewed previous lab work and discussed plan of care. [employer] has been approved and will need to renew financial assistance after July 1st. Check lab work today. Plan for follow up in 2 months or sooner if needed with lab work on the same day.      Relevant Medications   bictegravir-emtricitabine-tenofovir AF (BIKTARVY) 50-200-25 MG TABS tablet   Other Relevant Orders   HIV-1 RNA quant-no reflex-bld   T-helper cell (CD4)- ([facility] clinic only)   Healthcare maintenance    Discussed importance of safe sexual practice and condom use. Condoms offered.  Declines vaccinations. Due for pneumococcal, meningococcal and tetanus.         I am having Maurice Young maintain his HYDROcodone-acetaminophen and Biktarvy.   Meds ordered this encounter   Medications   bictegravir-emtricitabine-tenofovir AF (BIKTARVY) 50-200-25 MG TABS tablet    Sig: Take 1 tablet by mouth daily.    Dispense:  28 tablet    Refill:  3    Order Specific Question:   Supervising Provider    Answer:   Carlyle Basques [[id number]]     Follow-up: Return in about 2 months (around 08/18/2022), or if symptoms worsen or fail to improve.   Terri Piedra, MSN, FNP-C Nurse Practitioner [facility] [place] number: [phone]

## 2022-06-18 NOTE — Assessment & Plan Note (Signed)
Maurice Young has improved adherence and good tolerance to Biktarvy. Reviewed previous lab work and discussed plan of care. [employer] has been approved and will need to renew financial assistance after July 1st. Check lab work today. Plan for follow up in 2 months or sooner if needed with lab work on the same day.

## 2022-06-18 NOTE — Telephone Encounter (Signed)
error 

## 2022-06-19 LAB — T-HELPER CELL (CD4) - (RCID CLINIC ONLY)
CD4 % Helper T Cell: 22 % — ABNORMAL LOW (ref 33–65)
CD4 T Cell Abs: 513 /uL (ref 400–1790)

## 2022-06-20 LAB — HIV-1 RNA QUANT-NO REFLEX-BLD
HIV 1 RNA Quant: 48 Copies/mL — ABNORMAL HIGH
HIV-1 RNA Quant, Log: 1.68 Log cps/mL — ABNORMAL HIGH

## 2022-07-05 ENCOUNTER — Other Ambulatory Visit (HOSPITAL_COMMUNITY): Payer: Self-pay

## 2022-07-09 ENCOUNTER — Other Ambulatory Visit: Payer: Self-pay | Admitting: Pharmacist

## 2022-07-09 DIAGNOSIS — Z21 Asymptomatic human immunodeficiency virus [HIV] infection status: Secondary | ICD-10-CM

## 2022-07-09 MED ORDER — BIKTARVY 50-200-25 MG PO TABS: 1.0000 | ORAL_TABLET | Freq: Every day | ORAL | 0 refills | Status: AC

## 2022-07-09 NOTE — Progress Notes (Signed)
Medication Samples have been provided to the patient.  Drug name: Biktarvy        Strength: 50/200/25 mg       Qty: 14 tablets (2 bottles)   LOT: [id number]   Exp.Date: 08/2024  Dosing instructions: Take one tablet by mouth once daily  The patient has been instructed regarding the correct time, dose, and frequency of taking this medication, including desired effects and most common side effects.   Vincent Ehrler L. Caterine Mcmeans, PharmD, BCIDP, AAHIVP, CPP Clinical Pharmacist Practitioner Infectious Diseases Clinical Pharmacist [facility] 12/12/2020, 10:07 AM  

## 2022-08-20 ENCOUNTER — Ambulatory Visit: Payer: Self-pay | Admitting: Family

## 2022-10-17 ENCOUNTER — Other Ambulatory Visit: Payer: Self-pay

## 2022-10-17 ENCOUNTER — Telehealth: Payer: Self-pay

## 2022-10-17 ENCOUNTER — Ambulatory Visit: Payer: Self-pay

## 2022-10-17 DIAGNOSIS — B20 Human immunodeficiency virus [HIV] disease: Secondary | ICD-10-CM

## 2022-10-17 DIAGNOSIS — Z79899 Other long term (current) drug therapy: Secondary | ICD-10-CM

## 2022-10-17 DIAGNOSIS — Z113 Encounter for screening for infections with a predominantly sexual mode of transmission: Secondary | ICD-10-CM

## 2022-10-17 NOTE — Telephone Encounter (Signed)
Patient called office requesting refills on Biktarvy. States that he is out of medication, been off for 3-4 days. Has not renewed financial assistance for the second half of the year.  Scheduled patient to come in today to do lab work and renew financial assistance. Will provide samples until assistance is approved. Follow up appointment scheduled.  Leatrice Jewels, RMA

## 2022-10-18 ENCOUNTER — Other Ambulatory Visit: Payer: Self-pay | Admitting: Family

## 2022-10-18 DIAGNOSIS — Z21 Asymptomatic human immunodeficiency virus [HIV] infection status: Secondary | ICD-10-CM

## 2022-10-18 LAB — URINE CYTOLOGY ANCILLARY ONLY
Chlamydia: NEGATIVE
Comment: NEGATIVE
Comment: NORMAL
Neisseria Gonorrhea: NEGATIVE

## 2022-10-19 LAB — T-HELPER CELL (CD4) - (RCID CLINIC ONLY)
CD4 % Helper T Cell: 27 % — ABNORMAL LOW (ref 33–65)
CD4 T Cell Abs: 552 /uL (ref 400–1790)

## 2022-10-19 LAB — CBC WITH DIFFERENTIAL/PLATELET
Absolute Monocytes: 676 cells/uL (ref 200–950)
Basophils Absolute: 33 cells/uL (ref 0–200)
Basophils Relative: 0.5 %
Eosinophils Absolute: 111 cells/uL (ref 15–500)
Eosinophils Relative: 1.7 %
HCT: 44.4 % (ref 38.5–50.0)
Hemoglobin: 15 g/dL (ref 13.2–17.1)
Lymphs Abs: 2256 cells/uL (ref 850–3900)
MCH: 32.4 pg (ref 27.0–33.0)
MCHC: 33.8 g/dL (ref 32.0–36.0)
MCV: 95.9 fL (ref 80.0–100.0)
MPV: 9.1 fL (ref 7.5–12.5)
Monocytes Relative: 10.4 %
Neutro Abs: 3426 cells/uL (ref 1500–7800)
Neutrophils Relative %: 52.7 %
Platelets: 312 10*3/uL (ref 140–400)
RBC: 4.63 10*6/uL (ref 4.20–5.80)
RDW: 11.7 % (ref 11.0–15.0)
Total Lymphocyte: 34.7 %
WBC: 6.5 10*3/uL (ref 3.8–10.8)

## 2022-10-19 LAB — LIPID PANEL
Cholesterol: 138 mg/dL (ref <200)
HDL: 47 mg/dL (ref >=40)
LDL Cholesterol (Calc): 73 mg/dL (calc)
Non-HDL Cholesterol (Calc): 91 mg/dL (calc) (ref <130)
Total CHOL/HDL Ratio: 2.9 (calc) (ref <5.0)
Triglycerides: 94 mg/dL (ref <150)

## 2022-10-19 LAB — COMPLETE METABOLIC PANEL WITH GFR
AG Ratio: 1.4 (calc) (ref 1.0–2.5)
ALT: 14 U/L (ref 9–46)
AST: 19 U/L (ref 10–40)
Albumin: 4.6 g/dL (ref 3.6–5.1)
Alkaline phosphatase (APISO): 80 U/L (ref 36–130)
BUN: 16 mg/dL (ref 7–25)
CO2: 28 mmol/L (ref 20–32)
Calcium: 9.5 mg/dL (ref 8.6–10.3)
Chloride: 102 mmol/L (ref 98–110)
Creat: 1 mg/dL (ref 0.60–1.26)
Globulin: 3.2 g/dL (calc) (ref 1.9–3.7)
Glucose, Bld: 89 mg/dL (ref 65–99)
Potassium: 3.9 mmol/L (ref 3.5–5.3)
Sodium: 138 mmol/L (ref 135–146)
Total Bilirubin: 0.6 mg/dL (ref 0.2–1.2)
Total Protein: 7.8 g/dL (ref 6.1–8.1)
eGFR: 104 mL/min/{1.73_m2} (ref >=60)

## 2022-10-19 LAB — HIV-1 RNA QUANT-NO REFLEX-BLD
HIV 1 RNA Quant: NOT DETECTED Copies/mL
HIV-1 RNA Quant, Log: NOT DETECTED Log cps/mL

## 2022-10-19 LAB — RPR: RPR Ser Ql: NONREACTIVE

## 2022-10-23 ENCOUNTER — Other Ambulatory Visit: Payer: Self-pay | Admitting: Pharmacist

## 2022-10-23 DIAGNOSIS — B20 Human immunodeficiency virus [HIV] disease: Secondary | ICD-10-CM

## 2022-10-23 MED ORDER — BICTEGRAVIR-EMTRICITAB-TENOFOV 50-200-25 MG PO TABS: 1.0000 | ORAL_TABLET | Freq: Every day | ORAL | 0 refills | Status: AC

## 2022-10-23 NOTE — Progress Notes (Signed)
Medication Samples have been provided to the patient.  Drug name: Biktarvy        Strength: 50/200/25 mg       Qty: 14 tablets (2 bottles) LOT: [id number]   Exp.Date: 9/25  Dosing instructions: Take one tablet by mouth once daily  The patient has been instructed regarding the correct time, dose, and frequency of taking this medication, including desired effects and most common side effects.   Brytney Somes, PharmD, CPP, BCIDP, AAHIVP Clinical Pharmacist Practitioner Infectious Diseases Clinical Pharmacist [facility]  

## 2022-11-01 ENCOUNTER — Encounter: Payer: Self-pay | Admitting: Family

## 2022-11-01 ENCOUNTER — Other Ambulatory Visit: Payer: Self-pay

## 2022-11-01 ENCOUNTER — Ambulatory Visit (INDEPENDENT_AMBULATORY_CARE_PROVIDER_SITE_OTHER): Payer: Self-pay | Admitting: Family

## 2022-11-01 VITALS — BP 117/78 | HR 83 | Temp 97.7°F | Ht 74.0 in | Wt 260.0 lb

## 2022-11-01 DIAGNOSIS — Z21 Asymptomatic human immunodeficiency virus [HIV] infection status: Secondary | ICD-10-CM

## 2022-11-01 DIAGNOSIS — Z0000 Encounter for general adult medical examination without abnormal findings: Secondary | ICD-10-CM

## 2022-11-01 DIAGNOSIS — R051 Acute cough: Secondary | ICD-10-CM

## 2022-11-01 DIAGNOSIS — R059 Cough, unspecified: Secondary | ICD-10-CM | POA: Insufficient documentation

## 2022-11-01 MED ORDER — BIKTARVY 50-200-25 MG PO TABS: 1.0000 | ORAL_TABLET | Freq: Every day | ORAL | 5 refills | Status: DC

## 2022-11-01 NOTE — Assessment & Plan Note (Signed)
Discussed importance of safe sexual practice and condom use. Condoms and STD testing offered.  Declines vaccinations.  

## 2022-11-01 NOTE — Patient Instructions (Addendum)
Nice to see you.  We will check your lab work today.  Continue to take your medication daily as prescribed.  Refills have been sent to the pharmacy.  Plan for follow up in 4 months or sooner if needed with lab work on the same day.  Have a great day and stay safe!   Meningococcal ACWY Vaccine Injection What is this medication? MENINGOCOCCAL ACWY VACCINE (muh nin jeh KOK kul ACWY vak SEEN), or MENINGOCOCCAL CONJUGATE VACCINE (muh nin jeh KOK kul KON juh geyt vak SEEN), reduces the risk of meningitis. It does not treat meningitis. It is still possible to get meningitis after receiving this vaccine, but the symptoms may be less severe or not last as long. It works by helping your immune system learn how to fight off a future infection. This medicine may be used for other purposes; ask your health care provider or pharmacist if you have questions. COMMON BRAND NAME(S): Menactra, Menveo What should I tell my care team before I take this medication? They need to know if you have any of these conditions: Bleeding disorder Fever or infection History of Guillain-Barre syndrome Immune system problems An unusual or allergic reaction to diphtheria toxoid, meningococcal vaccine, latex, other vaccines, other medications, foods, dyes, or preservatives Pregnant or trying to get pregnant Breastfeeding How should I use this medication? This medication is injected into a muscle. It is given by your care team. A copy of Vaccine Information Statements will be given before each vaccination. Be sure to read this information carefully each time. This sheet may change often. Talk to your care team about the use of this medication in children. While it may be prescribed for children as young as 9 months for selected conditions, precautions do apply. Overdosage: If you think you have taken too much of this medicine contact a poison control center or emergency room at once. NOTE: This medicine is only for you.  Do not share this medicine with others. What if I miss a dose? This does not apply. What may interact with this medication? Adalimumab Anakinra Certain medications for arthritis Infliximab Medications for organ transplant Medications to treat cancer Medications used during some procedures to diagnose a medical condition Other vaccines Steroid medications, such as prednisone or cortisone This list may not describe all possible interactions. Give your health care provider a list of all the medicines, herbs, non-prescription drugs, or dietary supplements you use. Also tell them if you smoke, drink alcohol, or use illegal drugs. Some items may interact with your medicine. What should I watch for while using this medication? Report any side effects to your care team right away. Call your care team if you have any unusual symptoms within 6 weeks of getting this vaccine. This vaccine may not protect from all meningitis infections. Talk to your care team if you may be pregnant. What side effects may I notice from receiving this medication? Side effects that you should report to your care team as soon as possible: Allergic reactions--skin rash, itching, hives, swelling of the face, lips, tongue, or throat Feeling faint or lightheaded Side effects that usually do not require medical attention (report these to your care team if they continue or are bothersome): Diarrhea General discomfort and fatigue Headache Irritability Muscle pain Pain, redness, or irritation at injection site This list may not describe all possible side effects. Call your doctor for medical advice about side effects. You may report side effects to FDA at 1-800-FDA-1088. Where should I keep my medication?  This vaccine is only given by your care team. It will not be stored at home. NOTE: This sheet is a summary. It may not cover all possible information. If you have questions about this medicine, talk to your doctor,  pharmacist, or health care provider.  2023 Elsevier/Gold Standard (2008-02-07 00:00:00)

## 2022-11-01 NOTE — Assessment & Plan Note (Signed)
Maurice Young continues to have well-controlled virus with good adherence and tolerance to [employer].  Reviewed previous lab work and discussed plan of care.  Check lab work today.  Continue current dose of Biktarvy.  Plan for follow-up in 4 months or sooner if needed with lab work on the same day.

## 2022-11-01 NOTE — Assessment & Plan Note (Signed)
Maurice Young has acute onset cough with likely origin of either post viral syndrome or poorly controlled gastroesophageal reflux.  No evidence of bacterial infection.  Recommend over-the-counter medications as needed for symptom relief and supportive care.  Can try over-the-counter omeprazole to see if this helps with his acid reflux and improvement in his cough.  Follow-up if symptoms worsen or do not improve.

## 2022-11-01 NOTE — Progress Notes (Signed)
  Brief Narrative   Patient ID: Maurice Young, male    DOB: 07/19/1991, 30 y.o.   MRN: [id number]  Maurice Young is a 30 y/o AA male diagnosed with HIV infection in on 09/2018 with initial CD4 count of 570 and viral load 28,000. Genotype with K103N (R-efavirenz and nevirapine). No history of opportunistic infection. [id number] negative Entered care at [facility] Stage [facility].Sole ART regimen of Biktarvy.    Subjective:    Chief Complaint  Patient presents with   Follow-up   HIV Positive/AIDS    HPI:  Maurice Young is a 30 y.o. male with HIV disease last seen on 06/18/22 with improved adherence and good tolerance to Biktarvy. Viral load was 48 with CD4 count 513. Most recent lab work completed on 10/17/22 with undetectable viral load and CD4 count 552.  STD testing negative for gonorrhea, chlamydia and syphilis. Kidney function, liver function and electrolytes within normal ranges. Here today for routine follow up.   Maurice Young. Young had a bit of a cough that Young been going for the past several weeks that started with congestion that Young resolved. Cough appears to be improving slowly. Denies any current fevers, chills or other symptoms. Does have acid reflux which waxes and wanes. Taking his Biktarvy as prescribed with no adverse side effects or problems obtaining medication from the pharmacy. 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 Condoms and STD testing offered. Declines vaccinations.   Denies fevers, chills, night sweats, headaches, changes in vision, neck pain/stiffness, nausea, diarrhea, vomiting, lesions or rashes.  Allergies  Allergen Reactions   Ibuprofen Anaphylaxis and Swelling   Nsaids Anaphylaxis      Outpatient Medications Prior to Young  Medication Sig Dispense Refill   BIKTARVY 50-200-25 MG TABS tablet TAKE 1 TABLET BY MOUTH DAILY 30 tablet 0   HYDROcodone-acetaminophen (NORCO/VICODIN) 5-325 MG tablet Take 1 tablet by  mouth every 4 (four) hours as needed. (Patient not taking: Reported on 11/01/2022) 20 tablet 0   No facility-administered medications prior to Young.     Past Medical History:  Diagnosis Date   Asthma    HIV (human immunodeficiency virus infection) (HCC) 12/08/2018   Dx 09/2018. VL - ; CD4 nadir -  Genotype 09-2018 K103N  HLA B*5701 (-) Hep A Ab (-)  Hep B sAg (-)  Hep B sAb (-)  Hep B cAb (-)  Hep C Ab (-)  Quanitferon (-)       Past Surgical History:  Procedure Laterality Date   NO PAST SURGERIES        Review of Systems  Constitutional:  Negative for appetite change, chills, fatigue, fever and unexpected weight change.  Eyes:  Negative for visual disturbance.  Respiratory:  Negative for cough, chest tightness, shortness of breath and wheezing.   Cardiovascular:  Negative for chest pain and leg swelling.  Gastrointestinal:  Negative for abdominal pain, constipation, diarrhea, nausea and vomiting.  Genitourinary:  Negative for dysuria, flank pain, frequency, genital sores, hematuria and urgency.  Skin:  Negative for rash.  Allergic/Immunologic: Negative for immunocompromised state.  Neurological:  Negative for dizziness and headaches.      Objective:    BP 117/78   Pulse 83   Temp 97.7 F (36.5 C) (Oral)   Ht 6' 2" (1.88 m)   Wt 260 lb (117.9 kg)   SpO2 96%   BMI 33.38 kg/m  Nursing note and vital signs reviewed.  Physical Exam   Constitutional:      General: He is not in acute distress.    Appearance: He is well-developed.  Eyes:     Conjunctiva/sclera: Conjunctivae normal.  Cardiovascular:     Rate and Rhythm: Normal rate and regular rhythm.     Heart sounds: Normal heart sounds. No murmur heard.    No friction rub. No gallop.  Pulmonary:     Effort: Pulmonary effort is normal. No respiratory distress.     Breath sounds: Normal breath sounds. No wheezing or rales.  Chest:     Chest wall: No tenderness.  Abdominal:     General: Bowel sounds are normal.      Palpations: Abdomen is soft.     Tenderness: There is no abdominal tenderness.  Musculoskeletal:     Cervical back: Neck supple.  Lymphadenopathy:     Cervical: No cervical adenopathy.  Skin:    General: Skin is warm and dry.     Findings: No rash.  Neurological:     Mental Status: He is alert and oriented to person, place, and time.  Psychiatric:        Behavior: Behavior normal.        Thought Content: Thought content normal.        Judgment: Judgment normal.         11/01/2022    1:40 PM 05/16/2022    1:59 PM 12/09/2018   10:33 AM  Depression screen PHQ 2/9  Decreased Interest 0 0 1  Down, Depressed, Hopeless 0 0 1  PHQ - 2 Score 0 0 2  Altered sleeping   1  Tired, decreased energy   1  Change in appetite   2  Feeling bad or failure about yourself    1  Trouble concentrating   1  Moving slowly or fidgety/restless   1  Suicidal thoughts   1  PHQ-9 Score   10  Difficult doing work/chores   Somewhat difficult       Assessment & Plan:    Patient Active Problem List   Diagnosis Date Noted   Cough 11/01/2022   Insomnia 01/06/2019   Healthcare maintenance 12/10/2018   Adjustment disorder with mixed anxiety and depressed mood 12/10/2018   HIV (human immunodeficiency virus infection) (HCC) 12/08/2018     Problem List Items Addressed This Young       Other   HIV (human immunodeficiency virus infection) (HCC)    Maurice Young continues to have well-controlled virus with good adherence and tolerance to Biktarvy.  Reviewed previous lab work and discussed plan of care.  Check lab work today.  Continue current dose of Biktarvy.  Plan for follow-up in 4 months or sooner if needed with lab work on the same day.      Relevant Medications   bictegravir-emtricitabine-tenofovir AF (BIKTARVY) 50-200-25 MG TABS tablet   Healthcare maintenance - Primary    Discussed importance of safe sexual practice and condom use. Condoms and STD testing offered.   Declines vaccinations.       Cough    Maurice Young Young acute onset cough with likely origin of either post viral syndrome or poorly controlled gastroesophageal reflux.  No evidence of bacterial infection.  Recommend over-the-counter medications as needed for symptom relief and supportive care.  Can try over-the-counter omeprazole to see if this helps with his acid reflux and improvement in his cough.  Follow-up if symptoms worsen or do not improve.        I have discontinued Maurice Young's HYDROcodone-acetaminophen.   I have also changed his Biktarvy.   Meds ordered this encounter  Medications   bictegravir-emtricitabine-tenofovir AF (BIKTARVY) 50-200-25 MG TABS tablet    Sig: Take 1 tablet by mouth daily.    Dispense:  30 tablet    Refill:  5    Order Specific Question:   Supervising Provider    Answer:   Carlyle Basques [[id number]]     Follow-up: Return in about 4 months (around 03/02/2023), or if symptoms worsen or fail to improve.   Terri Piedra, MSN, FNP-C Nurse Practitioner [facility] [place] number: [phone]

## 2023-02-28 ENCOUNTER — Ambulatory Visit: Payer: Self-pay | Admitting: Family

## 2023-03-18 ENCOUNTER — Ambulatory Visit: Payer: Self-pay | Admitting: Family

## 2023-03-18 ENCOUNTER — Telehealth: Payer: Self-pay

## 2023-03-18 NOTE — Telephone Encounter (Signed)
Called patient to see if he would be able to make it to today's appointment, call could not be completed.   Woods Gangemi D Lametria Klunk, RN  

## 2023-12-02 ENCOUNTER — Other Ambulatory Visit: Payer: Self-pay | Admitting: Family

## 2023-12-02 DIAGNOSIS — Z21 Asymptomatic human immunodeficiency virus [HIV] infection status: Secondary | ICD-10-CM

## 2023-12-19 ENCOUNTER — Telehealth: Payer: Self-pay

## 2023-12-19 DIAGNOSIS — Z21 Asymptomatic human immunodeficiency virus [HIV] infection status: Secondary | ICD-10-CM

## 2023-12-19 MED ORDER — BIKTARVY 50-200-25 MG PO TABS: 1.0000 | ORAL_TABLET | Freq: Every day | ORAL | 0 refills | Status: DC

## 2023-12-19 NOTE — Addendum Note (Signed)
Addended by: Juanita Laster on: 12/19/2023 02:30 PM   Modules accepted: Orders

## 2023-12-19 NOTE — Telephone Encounter (Signed)
Patient called office today requesting refill on Biktarvy. Has been off medication for two months now. Last filled in September per refill history. Informed patient that I would need to reach out to his provider regarding refill due to missed doses. Will schedule appointment with pharmacy team or another provider since he has been seen in over a year.  Juanita Laster, RMA

## 2023-12-19 NOTE — Telephone Encounter (Signed)
Yes, that's fine to get restarted.

## 2024-01-02 ENCOUNTER — Ambulatory Visit: Payer: Self-pay | Admitting: Internal Medicine

## 2024-01-23 ENCOUNTER — Ambulatory Visit (INDEPENDENT_AMBULATORY_CARE_PROVIDER_SITE_OTHER): Payer: Self-pay | Admitting: Family

## 2024-01-23 ENCOUNTER — Other Ambulatory Visit: Payer: Self-pay

## 2024-01-23 ENCOUNTER — Encounter: Payer: Self-pay | Admitting: Family

## 2024-01-23 VITALS — BP 124/74 | HR 94 | Temp 97.9°F | Ht 74.0 in | Wt 264.0 lb

## 2024-01-23 DIAGNOSIS — Z113 Encounter for screening for infections with a predominantly sexual mode of transmission: Secondary | ICD-10-CM

## 2024-01-23 DIAGNOSIS — Z21 Asymptomatic human immunodeficiency virus [HIV] infection status: Secondary | ICD-10-CM

## 2024-01-23 DIAGNOSIS — Z0000 Encounter for general adult medical examination without abnormal findings: Secondary | ICD-10-CM

## 2024-01-23 DIAGNOSIS — B20 Human immunodeficiency virus [HIV] disease: Secondary | ICD-10-CM

## 2024-01-23 MED ORDER — BIKTARVY 50-200-25 MG PO TABS: 1.0000 | ORAL_TABLET | Freq: Every day | ORAL | 5 refills | Status: DC

## 2024-01-23 NOTE — Patient Instructions (Addendum)
Nice to see you.  We will check your lab work today.  Continue to take your medication daily as prescribed.  Refills have been sent to the pharmacy.  Plan for follow up in 1 months or sooner if needed with lab work on the same day.  Have a great day and stay safe!  

## 2024-01-23 NOTE — Assessment & Plan Note (Signed)
Mr. Maurice Young has poorly controlled virus secondary to being off medication for about 2 months now due to financial assistance expiring and now covered by [employer] White. Reviewed previous lab work and discussed plan of care and U equals U and importance of routine follow up and taking medication daily. Check lab work. Continue current dose of Biktarvy (samples provided). Plan for follow up in 1 month or sooner if needed with lab work on the same day.

## 2024-01-23 NOTE — Progress Notes (Signed)
Brief Narrative   Patient ID: Maurice Young, male    DOB: [birth year], 33 y.o.   MRN: [id number]  Maurice Young is a 33 y/o AA male diagnosed with HIV infection in on 09/2018 with initial CD4 count of 570 and viral load 28,000. Genotype with K103N (R-efavirenz and nevirapine). No history of opportunistic infection. [id number] negative Entered care at [facility] Stage 1.Sole ART regimen of Biktarvy.    Subjective:    Chief Complaint  Patient presents with   Follow-up    B20    HPI:  Maurice Young is a 33 y.o. male with HIV disease last seen on 11/01/2022 with well-controlled virus and good adherence and tolerance to [employer].  Viral load was undetectable with CD4 count 552.  RPR nonreactive.  Kidney function, liver function, electrolytes within normal ranges.  Lipid profile with triglycerides 94, LDL 73, and HDL 47.  Here today for routine follow-up.  Maurice Young has been doing well since his last office visit and is currently working at [employer] where he was promoted to [employer]. Has been off medication for the past 2 months secondary to financial assistance expiration. Was able to renew with Juanell Fairly and [employer] in December. Good adherence and tolerance previously. Housing and access to food is stable. Has issues with transportation and uses bus service or [employer] when needed. Requesting for medication to be mailed to his home address. Has questions about insurance and coverage. Condoms and site specific STD testing offered. Vaccination and healthcare maintenance reviewed.   Denies fevers, chills, night sweats, headaches, changes in vision, neck pain/stiffness, nausea, diarrhea, vomiting, lesions or rashes.  Lab Results  Component Value Date   CD4TCELL 27 (L) 10/17/2022   CD4TABS 552 10/17/2022   Lab Results  Component Value Date   HIV1RNAQUANT Not Detected 10/17/2022     Allergies  Allergen Reactions   Ibuprofen Anaphylaxis and Swelling   Nsaids Anaphylaxis      Outpatient  Medications Prior to Visit  Medication Sig Dispense Refill   bictegravir-emtricitabine-tenofovir AF (BIKTARVY) 50-200-25 MG TABS tablet Take 1 tablet by mouth daily. 30 tablet 0   No facility-administered medications prior to visit.     Past Medical History:  Diagnosis Date   Asthma    HIV (human immunodeficiency virus infection) (HCC) 12/08/2018   Dx 09/2018. VL - ; CD4 nadir -  Genotype 09-2018 K103N  HLA B*5701 (-) Hep A Ab (-)  Hep B sAg (-)  Hep B sAb (-)  Hep B cAb (-)  Hep C Ab (-)  Quanitferon (-)       Past Surgical History:  Procedure Laterality Date   NO PAST SURGERIES        Review of Systems  Constitutional:  Negative for appetite change, chills, fatigue, fever and unexpected weight change.  Eyes:  Negative for visual disturbance.  Respiratory:  Negative for cough, chest tightness, shortness of breath and wheezing.   Cardiovascular:  Negative for chest pain and leg swelling.  Gastrointestinal:  Negative for abdominal pain, constipation, diarrhea, nausea and vomiting.  Genitourinary:  Negative for dysuria, flank pain, frequency, genital sores, hematuria and urgency.  Skin:  Negative for rash.  Allergic/Immunologic: Negative for immunocompromised state.  Neurological:  Negative for dizziness and headaches.      Objective:    BP 124/74   Pulse 94   Temp 97.9 F (36.6 C) (Temporal)   Ht 6\' 2"  (1.88 m)   Wt 264 lb (119.7 kg)   SpO2 95%  BMI 33.90 kg/m  Nursing note and vital signs reviewed.  Physical Exam Constitutional:      General: He is not in acute distress.    Appearance: He is well-developed.  Eyes:     Conjunctiva/sclera: Conjunctivae normal.  Cardiovascular:     Rate and Rhythm: Normal rate and regular rhythm.     Heart sounds: Normal heart sounds. No murmur heard.    No friction rub. No gallop.  Pulmonary:     Effort: Pulmonary effort is normal. No respiratory distress.     Breath sounds: Normal breath sounds. No wheezing or rales.   Chest:     Chest wall: No tenderness.  Abdominal:     General: Bowel sounds are normal.     Palpations: Abdomen is soft.     Tenderness: There is no abdominal tenderness.  Musculoskeletal:     Cervical back: Neck supple.  Lymphadenopathy:     Cervical: No cervical adenopathy.  Skin:    General: Skin is warm and dry.     Findings: No rash.  Neurological:     Mental Status: He is alert and oriented to person, place, and time.  Psychiatric:        Behavior: Behavior normal.        Thought Content: Thought content normal.        Judgment: Judgment normal.         01/23/2024    4:12 PM 11/01/2022    1:40 PM 05/16/2022    1:59 PM 12/09/2018   10:33 AM  Depression screen PHQ 2/9  Decreased Interest 0 0 0 1  Down, Depressed, Hopeless 0 0 0 1  PHQ - 2 Score 0 0 0 2  Altered sleeping    1  Tired, decreased energy    1  Change in appetite    2  Feeling bad or failure about yourself     1  Trouble concentrating    1  Moving slowly or fidgety/restless    1  Suicidal thoughts    1  PHQ-9 Score    10  Difficult doing work/chores    Somewhat difficult       Assessment & Plan:    Patient Active Problem List   Diagnosis Date Noted   Cough 11/01/2022   Insomnia 01/06/2019   Healthcare maintenance 12/10/2018   Adjustment disorder with mixed anxiety and depressed mood 12/10/2018   HIV (human immunodeficiency virus infection) (HCC) 12/08/2018     Problem List Items Addressed This Visit       Other   HIV (human immunodeficiency virus infection) (HCC) - Primary   Maurice Young has poorly controlled virus secondary to being off medication for about 2 months now due to financial assistance expiring and now covered by [employer]. Reviewed previous lab work and discussed plan of care and U equals U and importance of routine follow up and taking medication daily. Check lab work. Continue current dose of Biktarvy (samples provided). Plan for follow up in 1 month or sooner if needed  with lab work on the same day.       Relevant Medications   bictegravir-emtricitabine-tenofovir AF (BIKTARVY) 50-200-25 MG TABS tablet   Other Relevant Orders   COMPLETE METABOLIC PANEL WITH GFR   HIV-1 RNA quant-no reflex-bld   T-helper cell (CD4)- ([facility] clinic only)   Healthcare maintenance   Discussed importance of safe sexual practice and condom use. Condoms and site specific STD testing offered.  Vaccinations reviewed and declined.  Due for routine dental care and he wants to schedule independently.        Other Visit Diagnoses       Screening for STDs (sexually transmitted diseases)       Relevant Orders   RPR   Urine cytology ancillary only   Cytology (oral, anal, urethral) ancillary only   Cytology (oral, anal, urethral) ancillary only        I am having Maurice Young maintain his [employer].   Meds ordered this encounter  Medications   bictegravir-emtricitabine-tenofovir AF (BIKTARVY) 50-200-25 MG TABS tablet    Sig: Take 1 tablet by mouth daily.    Dispense:  30 tablet    Refill:  5    Supervising Provider:   Judyann Munson [phone]    Prescription Type::   Renewal     Follow-up: Return in about 1 month (around 02/23/2024), or if symptoms worsen or fail to improve. or sooner if needed.    Marcos Eke, MSN, FNP-C Nurse Practitioner [facility] for Infectious Disease [facility] Main number: [phone]

## 2024-01-23 NOTE — Assessment & Plan Note (Signed)
Discussed importance of safe sexual practice and condom use. Condoms and site specific STD testing offered.  Vaccinations reviewed and declined.  Due for routine dental care and he wants to schedule independently.

## 2024-01-24 LAB — T-HELPER CELL (CD4) - (RCID CLINIC ONLY)
CD4 % Helper T Cell: 21 % — ABNORMAL LOW (ref 33–65)
CD4 T Cell Abs: 534 /uL (ref 400–1790)

## 2024-01-26 LAB — CYTOLOGY, (ORAL, ANAL, URETHRAL) ANCILLARY ONLY
Chlamydia: NEGATIVE
Chlamydia: POSITIVE — AB
Comment: NEGATIVE
Comment: NEGATIVE
Comment: NORMAL
Comment: NORMAL
Neisseria Gonorrhea: NEGATIVE
Neisseria Gonorrhea: POSITIVE — AB

## 2024-01-26 LAB — URINE CYTOLOGY ANCILLARY ONLY
Chlamydia: NEGATIVE
Comment: NEGATIVE
Comment: NORMAL
Neisseria Gonorrhea: NEGATIVE

## 2024-01-27 LAB — HIV-1 RNA QUANT-NO REFLEX-BLD
HIV 1 RNA Quant: 9970 {copies}/mL — ABNORMAL HIGH
HIV-1 RNA Quant, Log: 4 {Log} — ABNORMAL HIGH

## 2024-01-27 LAB — COMPLETE METABOLIC PANEL WITH GFR
AG Ratio: 1.5 (calc) (ref 1.0–2.5)
ALT: 16 U/L (ref 9–46)
AST: 20 U/L (ref 10–40)
Albumin: 4.3 g/dL (ref 3.6–5.1)
Alkaline phosphatase (APISO): 89 U/L (ref 36–130)
BUN: 20 mg/dL (ref 7–25)
CO2: 23 mmol/L (ref 20–32)
Calcium: 9.4 mg/dL (ref 8.6–10.3)
Chloride: 105 mmol/L (ref 98–110)
Creat: 1.2 mg/dL (ref 0.60–1.26)
Globulin: 2.8 g/dL (ref 1.9–3.7)
Glucose, Bld: 94 mg/dL (ref 65–99)
Potassium: 4.2 mmol/L (ref 3.5–5.3)
Sodium: 137 mmol/L (ref 135–146)
Total Bilirubin: 0.5 mg/dL (ref 0.2–1.2)
Total Protein: 7.1 g/dL (ref 6.1–8.1)
eGFR: 82 mL/min/{1.73_m2} (ref >=60)

## 2024-01-27 LAB — RPR: RPR Ser Ql: NONREACTIVE

## 2024-01-28 ENCOUNTER — Telehealth: Payer: Self-pay

## 2024-01-28 ENCOUNTER — Ambulatory Visit: Payer: Self-pay | Admitting: Family

## 2024-01-28 NOTE — Telephone Encounter (Signed)
Called patient to relay results and discuss treatment, no answer. Left HIPAA compliant voicemail requesting callback.   Sandie Ano, RN

## 2024-01-28 NOTE — Telephone Encounter (Signed)
Rectal swab positive for gonorrhea and chlamydia. Will route to provider.   Sandie Ano, RN

## 2024-01-29 NOTE — Telephone Encounter (Signed)
Attempted to contact patient - phone didn't ring, will try again later.

## 2024-01-30 NOTE — Telephone Encounter (Signed)
Attempted to reach the patient. No answer and  LVM

## 2024-01-31 ENCOUNTER — Other Ambulatory Visit: Payer: Self-pay | Admitting: Pharmacist

## 2024-01-31 MED ORDER — BICTEGRAVIR-EMTRICITAB-TENOFOV 50-200-25 MG PO TABS: 1.0000 | ORAL_TABLET | Freq: Every day | ORAL | Status: AC

## 2024-01-31 NOTE — Progress Notes (Signed)
Medication Samples have been provided to the patient.  Drug name: Biktarvy        Strength: 50/200/25 mg       Qty: 14 tablets (2 bottles) LOT: CSCFVA   Exp.Date: 10/26  Dosing instructions: Take one tablet by mouth once daily  The patient has been instructed regarding the correct time, dose, and frequency of taking this medication, including desired effects and most common side effects.   Margarite Gouge, PharmD, CPP, BCIDP, AAHIVP Clinical Pharmacist Practitioner Infectious Diseases Clinical Pharmacist [facility]

## 2024-02-06 NOTE — Telephone Encounter (Signed)
 Staff has tried multiple attempts to reach patient - left another voicemail. I will reach out to [facility].

## 2024-02-06 NOTE — Telephone Encounter (Signed)
 Paperwork faxed to [facility].

## 2024-02-24 ENCOUNTER — Ambulatory Visit: Payer: Self-pay | Admitting: Family

## 2024-02-27 ENCOUNTER — Telehealth: Payer: Self-pay

## 2024-02-27 NOTE — Telephone Encounter (Signed)
 Detectable Viral Load Intervention (DVL)  Most recent VL:  HIV 1 RNA Quant  Date Value Ref Range Status  01/23/2024 9,970 (H) Copies/mL Final  10/17/2022 Not Detected Copies/mL Final  06/18/2022 48 (H) Copies/mL Final    Last Clinic Visit: 01/23/24  Current ART regimen: Biktarvy  Appointment status: patient does not have future appointment scheduled   Medication last dispensed (per chart review):   Dispensed Days Supply Quantity Provider Pharmacy  BIKTARVY 50/200/25MG  TABLETS 01/31/2024 30 30 each Veryl Speak, FNP [facility]  BIKTARVY 50/200/25MG  TABLETS 09/21/2023 30 30 each Veryl Speak, FNP [facility] STORE #...  BIKTARVY 50/200/25MG  TABLETS 07/16/2023 30 30 each Veryl Speak, FNP [facility] DRUG STORE #..    Interventions   Called patient to discuss medication adherence and possible barriers to care.   No answer. Left HIPAA compliant voicemail requesting callback.   Sandie Ano, RN

## 2024-03-24 ENCOUNTER — Other Ambulatory Visit: Payer: Self-pay

## 2024-03-24 ENCOUNTER — Ambulatory Visit: Payer: Self-pay | Admitting: Family

## 2024-03-24 ENCOUNTER — Encounter: Payer: Self-pay | Admitting: Family

## 2024-03-24 VITALS — BP 132/79 | HR 86 | Temp 98.0°F | Ht 74.0 in | Wt 260.0 lb

## 2024-03-24 DIAGNOSIS — Z720 Tobacco use: Secondary | ICD-10-CM | POA: Insufficient documentation

## 2024-03-24 DIAGNOSIS — Z0000 Encounter for general adult medical examination without abnormal findings: Secondary | ICD-10-CM

## 2024-03-24 DIAGNOSIS — A549 Gonococcal infection, unspecified: Secondary | ICD-10-CM

## 2024-03-24 DIAGNOSIS — F4323 Adjustment disorder with mixed anxiety and depressed mood: Secondary | ICD-10-CM

## 2024-03-24 DIAGNOSIS — A749 Chlamydial infection, unspecified: Secondary | ICD-10-CM

## 2024-03-24 DIAGNOSIS — Z21 Asymptomatic human immunodeficiency virus [HIV] infection status: Secondary | ICD-10-CM

## 2024-03-24 DIAGNOSIS — F17210 Nicotine dependence, cigarettes, uncomplicated: Secondary | ICD-10-CM

## 2024-03-24 MED ORDER — DOXYCYCLINE HYCLATE 100 MG PO TABS: 200.0000 mg | ORAL_TABLET | Freq: Every day | ORAL | 0 refills | Status: AC | PRN

## 2024-03-24 MED ORDER — BIKTARVY 50-200-25 MG PO TABS: 1.0000 | ORAL_TABLET | Freq: Every day | ORAL | 2 refills | Status: DC

## 2024-03-24 MED ORDER — CEFTRIAXONE SODIUM 500 MG IJ SOLR
500.0000 mg | Freq: Once | INTRAMUSCULAR | Status: AC
  Administered 2024-03-24: 500 mg via INTRAMUSCULAR

## 2024-03-24 MED ORDER — AZITHROMYCIN 250 MG PO TABS
1000.0000 mg | ORAL_TABLET | Freq: Once | ORAL | Status: AC
  Administered 2024-03-24: 1000 mg via ORAL

## 2024-03-24 NOTE — Assessment & Plan Note (Signed)
 Treated with 1 g Azithromycin PO once. Advised to abstain from sexual contact for the next 7 days.

## 2024-03-24 NOTE — Assessment & Plan Note (Signed)
 PHQ9 score elevated at 10 with GAD-7 score of 9 (moderate). Recommend counseling to start with for CBT. Can consider medication if symptoms worsen or do not improve.

## 2024-03-24 NOTE — Assessment & Plan Note (Signed)
 Counseled on the dangers of tobacco not ready to quit at this time.  Reviewed strategies to maximize success, including removing cigarettes and smoking materials from environment, stress management, substitution of other forms of reinforcement, support of family/friends, and written materials.

## 2024-03-24 NOTE — Assessment & Plan Note (Addendum)
 Discussed importance of safe sexual practice and condom use. Condoms and site specific STD testing offered.  Vaccinations reviewed and declined following counseling. Due for dental care and referred to [facility]. Doxycycline prescribed for doxyPEP

## 2024-03-24 NOTE — Addendum Note (Signed)
 Addended by: Linna Hoff D on: 03/24/2024 04:11 PM   Modules accepted: Orders

## 2024-03-24 NOTE — Assessment & Plan Note (Signed)
 Mr. Warwick has improved adherence and good tolerance to Biktarvy.  Reviewed previous lab work and discussed plan of care and U equals U.  Reminded of importance of taking medication daily as prescribed to reduce risk of disease progression and complications in the future.  Covered by Alda Ponder and no problems obtaining medication from the pharmacy.  Social determinants of health reviewed with no interventions indicated.  Continue current dose of Biktarvy.  Check blood work.  Plan for follow-up in 2 months or sooner if needed with lab work on the same day.

## 2024-03-24 NOTE — Assessment & Plan Note (Signed)
 Treated with 500 mg ceftriaxone. Advised to the abstain from sexual contact for the next 7 days.

## 2024-03-24 NOTE — Patient Instructions (Addendum)
 Nice to see you.  We will check your lab work today.  Please call [facility]) to schedule/follow up on your dental care at [phone]  Continue to take your medication daily as prescribed.  Refills have been sent to the pharmacy.  Plan for follow up in 2 months or sooner if needed with lab work on the same day.  Have a great day and stay safe!   Quitting Smoking Learn how quitting smoking can help prevent many health problems and improve your overall health. To view the content, go to this web address: https://pe.elsevier.[email or web address]  This video will expire on: 12/11/2025. If you need access to this video following this date, please reach out to the healthcare provider who assigned it to you. This information is not intended to replace advice given to you by your health care provider. Make sure you discuss any questions you have with your health care provider. Elsevier Patient Education  2024 [employer].

## 2024-03-24 NOTE — Progress Notes (Signed)
 Brief Narrative   Patient ID: Maurice Young, male    DOB: 19-Jun-1991, 33 y.o.   MRN: [id number]  Maurice Young is a 33 y/o AA male diagnosed with HIV infection in on 09/2018 with initial CD4 count of 570 and viral load 28,000. Genotype with K103N (R-efavirenz and nevirapine). No history of opportunistic infection. [id number] negative Entered care at [facility] Stage 1.Sole ART regimen of Biktarvy.    Subjective:    Chief Complaint  Patient presents with   Follow-up    B20    HPI:  Maurice Young is a 33 y.o. male with HIV disease last seen on 01/23/2024 with poorly controlled virus secondary to being off medication for at least 2 months.  Good tolerance when taking Biktarvy.  Viral load was 9970 with CD4 count of 534.  Kidney function, liver function, electrolytes within normal ranges.  Was positive for chlamydia and gonorrhea.  RPR nonreactive.  Here today for follow-up.  Maurice Young has been doing okay since his last office visit and has improved adherence and good tolerance to Biktarvy.  No problems obtaining medication from the pharmacy.  He will be moving places and hopes to be in his new place by Friday at the latest.  Following discussion we will pick up medication at the pharmacy for the next month to ensure no interruptions in care.  Continues to work full-time with housing stable, good access to food, and transportation via personal vehicle.  Has not been treated for gonorrhea/chlamydia from previous positive test.  Healthcare maintenance reviewed.  Due for routine dental care.  Denies fevers, chills, night sweats, headaches, changes in vision, neck pain/stiffness, nausea, diarrhea, vomiting, lesions or rashes.  Lab Results  Component Value Date   CD4TCELL 21 (L) 01/23/2024   CD4TABS 534 01/23/2024   Lab Results  Component Value Date   HIV1RNAQUANT 9,970 (H) 01/23/2024     Allergies  Allergen Reactions   Ibuprofen Anaphylaxis and Swelling   Nsaids Anaphylaxis       Outpatient Medications Prior to Visit  Medication Sig Dispense Refill   bictegravir-emtricitabine-tenofovir AF (BIKTARVY) 50-200-25 MG TABS tablet Take 1 tablet by mouth daily. 30 tablet 5   No facility-administered medications prior to visit.     Past Medical History:  Diagnosis Date   Asthma    HIV (human immunodeficiency virus infection) (HCC) 12/08/2018   Dx 09/2018. VL - ; CD4 nadir -  Genotype 09-2018 K103N  HLA B*5701 (-) Hep A Ab (-)  Hep B sAg (-)  Hep B sAb (-)  Hep B cAb (-)  Hep C Ab (-)  Quanitferon (-)       Past Surgical History:  Procedure Laterality Date   NO PAST SURGERIES        Review of Systems  Constitutional:  Negative for appetite change, chills, fatigue, fever and unexpected weight change.  Eyes:  Negative for visual disturbance.  Respiratory:  Negative for cough, chest tightness, shortness of breath and wheezing.   Cardiovascular:  Negative for chest pain and leg swelling.  Gastrointestinal:  Negative for abdominal pain, constipation, diarrhea, nausea and vomiting.  Genitourinary:  Negative for dysuria, flank pain, frequency, genital sores, hematuria and urgency.  Skin:  Negative for rash.  Allergic/Immunologic: Negative for immunocompromised state.  Neurological:  Negative for dizziness and headaches.      Objective:    BP 132/79   Pulse 86   Temp 98 F (36.7 C) (Oral)   Ht 6\' 2"  (1.88 m)  Wt 260 lb (117.9 kg)   SpO2 96%   BMI 33.38 kg/m  Nursing note and vital signs reviewed.  Physical Exam Constitutional:      General: He is not in acute distress.    Appearance: He is well-developed.  Eyes:     Conjunctiva/sclera: Conjunctivae normal.  Cardiovascular:     Rate and Rhythm: Normal rate and regular rhythm.     Heart sounds: Normal heart sounds. No murmur heard.    No friction rub. No gallop.  Pulmonary:     Effort: Pulmonary effort is normal. No respiratory distress.     Breath sounds: Normal breath sounds. No wheezing or  rales.  Chest:     Chest wall: No tenderness.  Abdominal:     General: Bowel sounds are normal.     Palpations: Abdomen is soft.     Tenderness: There is no abdominal tenderness.  Musculoskeletal:     Cervical back: Neck supple.  Lymphadenopathy:     Cervical: No cervical adenopathy.  Skin:    General: Skin is warm and dry.     Findings: No rash.  Neurological:     Mental Status: He is alert and oriented to person, place, and time.  Psychiatric:        Behavior: Behavior normal.        Thought Content: Thought content normal.        Judgment: Judgment normal.         03/24/2024    3:33 PM 01/23/2024    4:12 PM 11/01/2022    1:40 PM 05/16/2022    1:59 PM 12/09/2018   10:33 AM  Depression screen PHQ 2/9  Decreased Interest 1 0 0 0 1  Down, Depressed, Hopeless 2 0 0 0 1  PHQ - 2 Score 3 0 0 0 2  Altered sleeping 2    1  Tired, decreased energy 1    1  Change in appetite 1    2  Feeling bad or failure about yourself  2    1  Trouble concentrating 0    1  Moving slowly or fidgety/restless 0    1  Suicidal thoughts 1    1  PHQ-9 Score 10    10  Difficult doing work/chores Somewhat difficult    Somewhat difficult        03/24/2024    3:33 PM  GAD 7 : Generalized Anxiety Score  Nervous, Anxious, on Edge 0  Control/stop worrying 2  Worry too much - different things 2  Trouble relaxing 2  Restless 1  Easily annoyed or irritable 2  Afraid - awful might happen 0  Total GAD 7 Score 9  Anxiety Difficulty Somewhat difficult         Assessment & Plan:    Patient Active Problem List   Diagnosis Date Noted   Gonorrhea 03/24/2024   Chlamydia 03/24/2024   Tobacco use 03/24/2024   Cough 11/01/2022   Insomnia 01/06/2019   Healthcare maintenance 12/10/2018   Adjustment disorder with mixed anxiety and depressed mood 12/10/2018   HIV (human immunodeficiency virus infection) (HCC) 12/08/2018     Problem List Items Addressed This Visit       Other   HIV (human  immunodeficiency virus infection) (HCC) - Primary   Maurice Young has improved adherence and good tolerance to Biktarvy.  Reviewed previous lab work and discussed plan of care and U equals U.  Reminded of importance of taking medication daily as prescribed to  reduce risk of disease progression and complications in the future.  Covered by Alda Ponder and no problems obtaining medication from the pharmacy.  Social determinants of health reviewed with no interventions indicated.  Continue current dose of Biktarvy.  Check blood work.  Plan for follow-up in 2 months or sooner if needed with lab work on the same day.      Relevant Medications   bictegravir-emtricitabine-tenofovir AF (BIKTARVY) 50-200-25 MG TABS tablet   Other Relevant Orders   HIV-1 RNA quant-no reflex-bld   T-helper cell (CD4)- ([facility] clinic only)   Healthcare maintenance   Discussed importance of safe sexual practice and condom use. Condoms and site specific STD testing offered.  Vaccinations reviewed and declined following counseling. Due for dental care and referred to [facility]. Doxycycline prescribed for doxyPEP      Adjustment disorder with mixed anxiety and depressed mood   PHQ9 score elevated at 10 with GAD-7 score of 9 (moderate). Recommend counseling to start with for CBT. Can consider medication if symptoms worsen or do not improve.       Gonorrhea   Treated with 500 mg ceftriaxone. Advised to the abstain from sexual contact for the next 7 days.       Relevant Medications   bictegravir-emtricitabine-tenofovir AF (BIKTARVY) 50-200-25 MG TABS tablet   Chlamydia   Treated with 1 g Azithromycin PO once. Advised to abstain from sexual contact for the next 7 days.       Relevant Medications   bictegravir-emtricitabine-tenofovir AF (BIKTARVY) 50-200-25 MG TABS tablet   Tobacco use   Counseled on the dangers of tobacco not ready to quit at this time.  Reviewed strategies to maximize success, including removing cigarettes  and smoking materials from environment, stress management, substitution of other forms of reinforcement, support of family/friends, and written materials.          I am having Maurice Young start on doxycycline. I am also having him maintain his Biktarvy. We administered cefTRIAXone and azithromycin.   Meds ordered this encounter  Medications   bictegravir-emtricitabine-tenofovir AF (BIKTARVY) 50-200-25 MG TABS tablet    Sig: Take 1 tablet by mouth daily.    Dispense:  30 tablet    Refill:  2    Supervising Provider:   Judyann Munson [phone]    Prescription Type::   Renewal   cefTRIAXone (ROCEPHIN) injection 500 mg   azithromycin (ZITHROMAX) tablet 1,000 mg   doxycycline (VIBRA-TABS) 100 MG tablet    Sig: Take 2 tablets (200 mg total) by mouth daily as needed. And within 72 hours after sexual contact.    Dispense:  20 tablet    Refill:  0    Supervising Provider:   Judyann Munson [[id number]]     Follow-up: Return in about 2 months (around 05/24/2024). or sooner if needed.    Marcos Eke, MSN, FNP-C Nurse Practitioner [facility] for Infectious Disease [facility] Main number: [phone]

## 2024-03-26 LAB — T-HELPER CELL (CD4) - (RCID CLINIC ONLY)
CD4 % Helper T Cell: 21 % — ABNORMAL LOW (ref 33–65)
CD4 T Cell Abs: 470 /uL (ref 400–1790)

## 2024-03-26 LAB — HIV-1 RNA QUANT-NO REFLEX-BLD
HIV 1 RNA Quant: 9480 {copies}/mL — ABNORMAL HIGH
HIV-1 RNA Quant, Log: 3.98 {Log_copies}/mL — ABNORMAL HIGH

## 2024-05-15 ENCOUNTER — Telehealth: Payer: Self-pay

## 2024-05-15 DIAGNOSIS — Z21 Asymptomatic human immunodeficiency virus [HIV] infection status: Secondary | ICD-10-CM

## 2024-05-15 MED ORDER — BIKTARVY 50-200-25 MG PO TABS: 1.0000 | ORAL_TABLET | Freq: Every day | ORAL | 0 refills | Status: DC

## 2024-05-15 NOTE — Telephone Encounter (Signed)
 Detectable Viral Load Intervention (DVL)  Most recent VL:  HIV 1 RNA Quant  Date Value Ref Range Status  03/24/2024 9,480 (H) NOT DETECTED copies/mL Final  01/23/2024 9,970 (H) Copies/mL Final  10/17/2022 Not Detected Copies/mL Final    Last Clinic Visit: 03/24/24  Current ART regimen: Biktarvy   Appointment status: patient has future appointment scheduled  Medication last dispensed (per chart review):   Dispensed Days Supply Quantity Provider Pharmacy  BIKTARVY  50/200/25MG  TABLETS 04/19/2024 30 30 each Calone, Gregory D, FNP [facility] DRUG STORE #...  BIKTARVY  50/200/25MG  TABLETS 03/25/2024 30 30 each Calone, Gregory D, FNP [facility] STORE #...  BIKTARVY  50/200/25MG  TABLETS 01/31/2024 30 30 each Calone, Gregory D, FNP [facility]    Medication Adherence   What pharmacy do you use for your ART? [facility]  Do you pick up your medication at the pharmacy or is it mailed to you? Pick up   How often do you miss a dose your ART?  Missed a week due to moving   Are you experiencing any side effects with your ART? no  Are you having any trouble remembering what medication(s) you are supposed to take or how you are supposed to take them? no  What helps you remember to take your medication(s)? N/a   Barriers to Care  Patient has moved to [place]. Is living with his Grandma.  Will work on establishing care with local provider. 30 day supply of Biktarvy  sent to [facility] on [address]; his sister will send this to him.  Understands that he will  need to establish care with local provider for additional refills.   Interventions   Called patient to discuss medication adherence and possible barriers to care.

## 2024-05-18 ENCOUNTER — Ambulatory Visit: Payer: Self-pay | Admitting: Family

## 2024-07-08 ENCOUNTER — Telehealth: Payer: Self-pay

## 2024-07-08 DIAGNOSIS — Z21 Asymptomatic human immunodeficiency virus [HIV] infection status: Secondary | ICD-10-CM

## 2024-07-08 NOTE — Telephone Encounter (Signed)
 Patient called office stating he has relocated to [place]  and is trying to establish care with new provider. Was informed that he would need medical records faxed before he could establish care with [facility] ID office.  Patient understands he will need to fax completed medical release to office  before staff can send records. Will fax last note and labs.  Referral placed with ID office information.  Lorenda CHRISTELLA Code, RMA

## 2024-10-20 ENCOUNTER — Encounter: Payer: Self-pay | Admitting: Family

## 2024-10-20 ENCOUNTER — Ambulatory Visit: Payer: Self-pay

## 2024-10-20 ENCOUNTER — Other Ambulatory Visit: Payer: Self-pay

## 2024-10-20 ENCOUNTER — Ambulatory Visit (INDEPENDENT_AMBULATORY_CARE_PROVIDER_SITE_OTHER): Payer: Self-pay | Admitting: Family

## 2024-10-20 VITALS — BP 109/74 | HR 90 | Temp 98.4°F | Resp 16 | Wt 245.8 lb

## 2024-10-20 DIAGNOSIS — Z79899 Other long term (current) drug therapy: Secondary | ICD-10-CM

## 2024-10-20 DIAGNOSIS — F17210 Nicotine dependence, cigarettes, uncomplicated: Secondary | ICD-10-CM

## 2024-10-20 DIAGNOSIS — Z720 Tobacco use: Secondary | ICD-10-CM

## 2024-10-20 DIAGNOSIS — Z21 Asymptomatic human immunodeficiency virus [HIV] infection status: Secondary | ICD-10-CM

## 2024-10-20 DIAGNOSIS — Z0000 Encounter for general adult medical examination without abnormal findings: Secondary | ICD-10-CM

## 2024-10-20 MED ORDER — BIKTARVY 50-200-25 MG PO TABS: 1.0000 | ORAL_TABLET | Freq: Every day | ORAL | 4 refills | Status: AC

## 2024-10-20 NOTE — Assessment & Plan Note (Signed)
 Discussed importance of safe sexual practice and condom use. Condoms and site specific STD testing offered.  Vaccinations reviewed and declined following counseling Due for routine dental care to be scheduled.

## 2024-10-20 NOTE — Patient Instructions (Addendum)
Nice to see you.  We will check your lab work today.  Continue to take your medication daily as prescribed.  Refills have been sent to the pharmacy.  Plan for follow up in 1 months or sooner if needed with lab work on the same day.  Have a great day and stay safe!  

## 2024-10-20 NOTE — Progress Notes (Signed)
 Brief Narrative   Patient ID: Maurice Young, male    DOB: 01/27/1991, 33 y.o.   MRN: [id number]  Maurice Young is a 33 y/o AA male diagnosed with HIV infection in on 09/2018 with initial CD4 count of 570 and viral load 28,000. Genotype with K103N (R-efavirenz and nevirapine). No history of opportunistic infection. [id number] negative Entered care at [facility] Stage 1.Sole ART regimen of Biktarvy .    Subjective:   Chief Complaint  Patient presents with   HIV Positive/AIDS    HPI:  Maurice Young is a 33 y.o. male with HIV disease last seen on 03/24/2024 with improved adherence and good tolerance to Biktarvy .  Viral load continue to remain poorly controlled at 9480 with CD4 count of 470.  Previous kidney function, liver function, electrolytes within normal ranges. Clinic received call on 07/08/24 that he relocated to [facility] and was working on establishing with provider there. Last refill of medication was in April 2025. Here today for follow up.   Maurice Young has been doing okay since his last office visit and has been off medication for about a month and has been on and off medication.  Currently covered by [employer].  Moved to [place]  for approximately 5 months to help with his mom and from the transition difficult and has since moved back to [place].  He is currently without medication.  Less than optimal medication adherence secondary to forgetfulness as well as mindset.  Has a partner who is positive and wishing to establish care.  Housing, transportation, and access to food are currently stable.  Working full-time.  Healthcare maintenance reviewed.  Condoms and site-specific STD testing offered.  Denies fevers, chills, night sweats, headaches, changes in vision, neck pain/stiffness, nausea, diarrhea, vomiting, lesions or rashes.  Lab Results  Component Value Date   CD4TCELL 21 (L) 03/24/2024   CD4TABS 470 03/24/2024   Lab Results  Component Value Date   HIV1RNAQUANT 9,480 (H) 03/24/2024      Allergies  Allergen Reactions   Ibuprofen Anaphylaxis and Swelling   Nsaids Anaphylaxis      Outpatient Medications Prior to Visit  Medication Sig Dispense Refill   bictegravir-emtricitabine-tenofovir AF (BIKTARVY ) 50-200-25 MG TABS tablet Take 1 tablet by mouth daily. 30 tablet 0   doxycycline  (VIBRA -TABS) 100 MG tablet Take 2 tablets (200 mg total) by mouth daily as needed. And within 72 hours after sexual contact. (Patient not taking: Reported on 10/20/2024) 20 tablet 0   No facility-administered medications prior to visit.     Past Medical History:  Diagnosis Date   Asthma    HIV (human immunodeficiency virus infection) (HCC) 12/08/2018   Dx 09/2018. VL - ; CD4 nadir -  Genotype 09-2018 K103N  HLA B*5701 (-) Hep A Ab (-)  Hep B sAg (-)  Hep B sAb (-)  Hep B cAb (-)  Hep C Ab (-)  Quanitferon (-)       Past Surgical History:  Procedure Laterality Date   NO PAST SURGERIES          Review of Systems  Constitutional:  Negative for appetite change, chills, fatigue, fever and unexpected weight change.  Eyes:  Negative for visual disturbance.  Respiratory:  Negative for cough, chest tightness, shortness of breath and wheezing.   Cardiovascular:  Negative for chest pain and leg swelling.  Gastrointestinal:  Negative for abdominal pain, constipation, diarrhea, nausea and vomiting.  Genitourinary:  Negative for dysuria, flank pain, frequency, genital sores, hematuria and urgency.  Skin:  Negative for rash.  Allergic/Immunologic: Negative for immunocompromised state.  Neurological:  Negative for dizziness and headaches.     Objective:   BP 109/74   Pulse 90   Temp 98.4 F (36.9 C)   Resp 16   Wt 245 lb 12.8 oz (111.5 kg)   SpO2 98%   BMI 31.56 kg/m  Nursing note and vital signs reviewed.  Physical Exam Constitutional:      General: He is not in acute distress.    Appearance: He is well-developed.  Eyes:     Conjunctiva/sclera: Conjunctivae normal.   Cardiovascular:     Rate and Rhythm: Normal rate and regular rhythm.     Heart sounds: Normal heart sounds. No murmur heard.    No friction rub. No gallop.  Pulmonary:     Effort: Pulmonary effort is normal. No respiratory distress.     Breath sounds: Normal breath sounds. No wheezing or rales.  Chest:     Chest wall: No tenderness.  Abdominal:     General: Bowel sounds are normal.     Palpations: Abdomen is soft.     Tenderness: There is no abdominal tenderness.  Musculoskeletal:     Cervical back: Neck supple.  Lymphadenopathy:     Cervical: No cervical adenopathy.  Skin:    General: Skin is warm and dry.     Findings: No rash.  Neurological:     Mental Status: He is alert and oriented to person, place, and time.  Psychiatric:        Behavior: Behavior normal.        Thought Content: Thought content normal.        Judgment: Judgment normal.          10/20/2024   10:49 AM 03/24/2024    3:33 PM 01/23/2024    4:12 PM 11/01/2022    1:40 PM 05/16/2022    1:59 PM  Depression screen PHQ 2/9  Decreased Interest 0 1 0 0 0  Down, Depressed, Hopeless 0 2 0 0 0  PHQ - 2 Score 0 3 0 0 0  Altered sleeping  2     Tired, decreased energy  1     Change in appetite  1     Feeling bad or failure about yourself   2     Trouble concentrating  0     Moving slowly or fidgety/restless  0     Suicidal thoughts  1     PHQ-9 Score  10     Difficult doing work/chores  Somewhat difficult           10/20/2024   10:49 AM 03/24/2024    3:33 PM  GAD 7 : Generalized Anxiety Score  Nervous, Anxious, on Edge 0 0  Control/stop worrying 0 2  Worry too much - different things 0 2  Trouble relaxing 0 2  Restless 0 1  Easily annoyed or irritable 0 2  Afraid - awful might happen 0 0  Total GAD 7 Score 0 9  Anxiety Difficulty Not difficult at all Somewhat difficult     The ASCVD Risk score (Arnett DK, et al., 2019) failed to calculate for the following reasons:   The 2019 ASCVD risk score is  only valid for ages 29 to 11      Assessment & Plan:    Patient Active Problem List   Diagnosis Date Noted   Gonorrhea 03/24/2024   Chlamydia 03/24/2024   Tobacco use 03/24/2024  Cough 11/01/2022   Insomnia 01/06/2019   Healthcare maintenance 12/10/2018   Adjustment disorder with mixed anxiety and depressed mood 12/10/2018   HIV (human immunodeficiency virus infection) (HCC) 12/08/2018     Problem List Items Addressed This Visit       Other   HIV (human immunodeficiency virus infection) (HCC) - Primary   Maurice Young has poorly controlled virus secondary to being off medication for at least a month and likely longer.  Discussed importance of taking medication daily as prescribed to reduce risk of disease progression and complications in the future.  He will work towards taking medication at least 6 days out of the week and making a part of his routine.  Check blood work including genotype.  May be a candidate for injectable medications.  Social determinants of health reviewed with no interventions indicated.  Restart Biktarvy .  Plan for follow-up in 1 month or sooner if needed.      Relevant Medications   bictegravir-emtricitabine-tenofovir AF (BIKTARVY ) 50-200-25 MG TABS tablet   Other Relevant Orders   Comprehensive metabolic panel with GFR   T-helper cell (CD4)- ([facility] clinic only)   HIV RNA, RTPCR W/R GT (RTI, PI,INT)   Healthcare maintenance   Discussed importance of safe sexual practice and condom use. Condoms and site specific STD testing offered.  Vaccinations reviewed and declined following counseling Due for routine dental care to be scheduled.       Tobacco use   Continues to smoke approximately half a pack of cigarettes per day. Counseled on the dangers of tobacco not ready to quit at this time.  Reviewed strategies to maximize success, including removing cigarettes and smoking materials from environment, stress management, substitution of other forms of  reinforcement, support of family/friends, and written materials.           I am having Maurice Young maintain his doxycycline  and Biktarvy .   Meds ordered this encounter  Medications   bictegravir-emtricitabine-tenofovir AF (BIKTARVY ) 50-200-25 MG TABS tablet    Sig: Take 1 tablet by mouth daily.    Dispense:  30 tablet    Refill:  4    Supervising Provider:   LUIZ CHANNEL [phone]    Prescription Type::   Renewal     Follow-up: Return in about 1 month (around 11/20/2024). or sooner if needed.    Cathlyn July, MSN, FNP-C Nurse Practitioner [facility] for Infectious Disease [facility] Main number: [phone]

## 2024-10-20 NOTE — Assessment & Plan Note (Signed)
 Continues to smoke approximately half a pack of cigarettes per day. Counseled on the dangers of tobacco not ready to quit at this time.  Reviewed strategies to maximize success, including removing cigarettes and smoking materials from environment, stress management, substitution of other forms of reinforcement, support of family/friends, and written materials.

## 2024-10-20 NOTE — Assessment & Plan Note (Signed)
 Maurice Young has poorly controlled virus secondary to being off medication for at least a month and likely longer.  Discussed importance of taking medication daily as prescribed to reduce risk of disease progression and complications in the future.  He will work towards taking medication at least 6 days out of the week and making a part of his routine.  Check blood work including genotype.  May be a candidate for injectable medications.  Social determinants of health reviewed with no interventions indicated.  Restart Biktarvy .  Plan for follow-up in 1 month or sooner if needed.

## 2024-10-21 LAB — T-HELPER CELL (CD4) - (RCID CLINIC ONLY)
CD4 % Helper T Cell: 29 % — ABNORMAL LOW (ref 33–65)
CD4 T Cell Abs: 713 /uL (ref 400–1790)

## 2024-10-23 ENCOUNTER — Ambulatory Visit: Payer: Self-pay | Admitting: Family

## 2024-10-23 LAB — COMPREHENSIVE METABOLIC PANEL WITH GFR
AG Ratio: 1.8 (calc) (ref 1.0–2.5)
ALT: 13 U/L (ref 9–46)
AST: 17 U/L (ref 10–40)
Albumin: 4.7 g/dL (ref 3.6–5.1)
Alkaline phosphatase (APISO): 80 U/L (ref 36–130)
BUN: 13 mg/dL (ref 7–25)
CO2: 28 mmol/L (ref 20–32)
Calcium: 9.7 mg/dL (ref 8.6–10.3)
Chloride: 104 mmol/L (ref 98–110)
Creat: 0.89 mg/dL (ref 0.60–1.26)
Globulin: 2.6 g/dL (ref 1.9–3.7)
Glucose, Bld: 94 mg/dL (ref 65–99)
Potassium: 4.4 mmol/L (ref 3.5–5.3)
Sodium: 139 mmol/L (ref 135–146)
Total Bilirubin: 0.8 mg/dL (ref 0.2–1.2)
Total Protein: 7.3 g/dL (ref 6.1–8.1)
eGFR: 117 mL/min/1.73m2 (ref >=60)

## 2024-10-23 LAB — HIV RNA, RTPCR W/R GT (RTI, PI,INT)
HIV 1 RNA Quant: 26 {copies}/mL — ABNORMAL HIGH
HIV-1 RNA Quant, Log: 1.41 {Log_copies}/mL — ABNORMAL HIGH

## 2024-10-29 ENCOUNTER — Other Ambulatory Visit: Payer: Self-pay

## 2024-10-29 DIAGNOSIS — Z113 Encounter for screening for infections with a predominantly sexual mode of transmission: Secondary | ICD-10-CM

## 2024-10-29 MED ORDER — DOXYCYCLINE HYCLATE 100 MG PO TABS: 100.0000 mg | ORAL_TABLET | Freq: Two times a day (BID) | ORAL | 0 refills | Status: AC

## 2024-10-30 LAB — CYTOLOGY, (ORAL, ANAL, URETHRAL) ANCILLARY ONLY
Chlamydia: NEGATIVE
Comment: NEGATIVE
Comment: NORMAL
Neisseria Gonorrhea: NEGATIVE

## 2024-10-30 LAB — URINE CYTOLOGY ANCILLARY ONLY
Chlamydia: NEGATIVE
Comment: NEGATIVE
Comment: NORMAL
Neisseria Gonorrhea: NEGATIVE

## 2024-10-30 LAB — RPR: RPR Ser Ql: NONREACTIVE

## 2024-10-30 NOTE — Addendum Note (Signed)
 Addended by: DALILA ANNABELLA SQUIBB on: 10/30/2024 08:47 AM   Modules accepted: Orders

## 2024-11-12 ENCOUNTER — Other Ambulatory Visit: Payer: Self-pay | Admitting: Family

## 2024-11-12 DIAGNOSIS — Z21 Asymptomatic human immunodeficiency virus [HIV] infection status: Secondary | ICD-10-CM

## 2024-11-24 ENCOUNTER — Ambulatory Visit: Payer: Self-pay | Admitting: Family

## 2024-12-24 ENCOUNTER — Emergency Department (HOSPITAL_COMMUNITY)
Admission: EM | Admit: 2024-12-24 | Discharge: 2024-12-24 | Disposition: A | Discharge status: Home / Self Care | Payer: Self-pay

## 2024-12-24 ENCOUNTER — Other Ambulatory Visit: Payer: Self-pay

## 2024-12-24 DIAGNOSIS — B349 Viral infection, unspecified: Secondary | ICD-10-CM | POA: Insufficient documentation

## 2024-12-24 DIAGNOSIS — Z21 Asymptomatic human immunodeficiency virus [HIV] infection status: Secondary | ICD-10-CM | POA: Insufficient documentation

## 2024-12-24 DIAGNOSIS — J45909 Unspecified asthma, uncomplicated: Secondary | ICD-10-CM | POA: Insufficient documentation

## 2024-12-24 LAB — RESP PANEL BY RT-PCR (RSV, FLU A&B, COVID)  RVPGX2
Influenza A by PCR: POSITIVE — AB
Influenza B by PCR: NEGATIVE
Resp Syncytial Virus by PCR: NEGATIVE
SARS Coronavirus 2 by RT PCR: NEGATIVE

## 2024-12-24 MED ORDER — ACETAMINOPHEN 500 MG PO TABS
1000.0000 mg | ORAL_TABLET | ORAL | Status: AC
  Administered 2024-12-24: 1000 mg via ORAL
  Filled 2024-12-24: qty 2

## 2024-12-24 NOTE — ED Triage Notes (Signed)
 Patient arrives POV for body aches and chills with back pain. Patient endorses no known sick contacts but has felt bad x few days. Alert oriented in no apparent distress. Ambulatory to lobby.

## 2024-12-24 NOTE — Discharge Instructions (Addendum)
 I suspect that you have influenza however I would like you to keep a close eye on your symptoms.  If you develop any new or worsening symptoms or difficulty breathing please return to the emergency room.  In the meantime take 1000 mg of Tylenol  every 6 hours.  Make sure you are drinking plenty of water.  Viral Illness TREATMENT  Treatment is directed at relieving symptoms. There is no cure. Antibiotics are not effective, because the infection is caused by a virus, not by bacteria. Treatment may include:  Increased fluid intake. Sports drinks offer valuable electrolytes, sugars, and fluids.  Breathing heated mist or steam (vaporizer or shower).  Eating chicken soup or other clear broths, and maintaining good nutrition.  Getting plenty of rest.  Using gargles or lozenges for comfort.  Increasing usage of your inhaler if you have asthma.  Return to work when your temperature has returned to normal.  Gargle warm salt water and spit it out for sore throat. Take benadryl to decrease sinus secretions. Continue to alternate between Tylenol  and ibuprofen for pain and fever control.  Follow Up: Follow up with your primary care doctor in 5-7 days for recheck of ongoing symptoms.  Return to emergency department for emergent changing or worsening of symptoms.

## 2024-12-24 NOTE — ED Provider Notes (Addendum)
 " [place] [facility] Provider Note   CSN: [id number] Arrival date & time: 12/24/24  9066     History   Chief Complaint Chief Complaint  Patient presents with   Generalized Body Aches   Chills    HPI Maurice Young is a 33 y.o. male.  Patient presents to the emergency department with complaint of bodyaches, chills, cough, fatigue, sinus congestion for 3 days. Symptoms began 3 days ago and seem to have worsened the first 2 days he states he is feeling a little bit better today and is not having any chest pain or difficulty breathing but feeling achy and rundown. The patient has tried to alleviate pain with no medications.     Patient is having none of the below:  SOB CP Hemoptysis  Confusion Neck stiffness/ROM limitation       HPI  Past Medical History:  Diagnosis Date   Asthma    HIV (human immunodeficiency virus infection) (HCC) 12/08/2018   Dx 09/2018. VL - ; CD4 nadir -  Genotype 09-2018 K103N  HLA B*5701 (-) Hep A Ab (-)  Hep B sAg (-)  Hep B sAb (-)  Hep B cAb (-)  Hep C Ab (-)  Quanitferon (-)      Patient Active Problem List   Diagnosis Date Noted   Gonorrhea 03/24/2024   Chlamydia 03/24/2024   Tobacco use 03/24/2024   Cough 11/01/2022   Insomnia 01/06/2019   Healthcare maintenance 12/10/2018   Adjustment disorder with mixed anxiety and depressed mood 12/10/2018   HIV (human immunodeficiency virus infection) (HCC) 12/08/2018    Past Surgical History:  Procedure Laterality Date   NO PAST SURGERIES          Home Medications    Prior to Admission medications  Medication Sig Start Date End Date Taking? Authorizing Provider  bictegravir-emtricitabine-tenofovir AF (BIKTARVY ) 50-200-25 MG TABS tablet Take 1 tablet by mouth daily. 10/20/24   Calone, Gregory D, FNP  doxycycline  (VIBRA -TABS) 100 MG tablet Take 2 tablets (200 mg total) by mouth daily as needed. And within 72 hours after sexual contact. Patient not  taking: Reported on 10/20/2024 03/24/24   Calone, Gregory D, FNP  doxycycline  (VIBRA -TABS) 100 MG tablet Take 1 tablet (100 mg total) by mouth 2 (two) times daily. 10/29/24   Calone, Gregory D, FNP  metoCLOPramide  (REGLAN ) 10 MG tablet Take 1 tablet (10 mg total) by mouth every 6 (six) hours as needed for nausea or vomiting. Patient not taking: Reported on 03/11/2020 12/25/18 03/20/20  Melvenia Corean SAILOR, NP    Family History Family History  Problem Relation Age of Onset   Healthy Mother    Healthy Father    Diabetes Other        both sides of the family, not sure who    Cancer Other     Social History Social History[1]   Allergies   Ibuprofen and Nsaids   Review of Systems Denies fevers, chills, difficulty swallowing or eating, changes in voice, pain under tongue, nausea, vomiting, lightheadedness or dizziness. No trismus   Physical Exam Updated Vital Signs BP 119/72 (BP Location: Right Arm)   Pulse (!) 103   Temp 98.4 F (36.9 C)   Resp 20   Ht 6' 2 (1.88 m)   Wt 112 kg   SpO2 93%   BMI 31.70 kg/m   Physical Exam Physical Exam  Constitutional: Pt appears well-developed and well-nourished.  HENT:  Head: Normocephalic.  Right Ear: Tympanic  membrane, external ear and ear canal normal.  Left Ear: Tympanic membrane, external ear and ear canal normal.  Nose: Nose normal. Right sinus exhibits no maxillary sinus tenderness and no frontal sinus tenderness. Left sinus exhibits no maxillary sinus tenderness and no frontal sinus tenderness.  Mouth/Throat: Uvula is midline, oropharynx is clear and moist and mucous membranes are normal. No oral lesions. No uvula swelling or lacerations. No oropharyngeal exudate, posterior oropharyngeal edema, posterior oropharyngeal erythema or tonsillar abscesses.  Eyes: Conjunctivae are normal. Pupils are equal, round, and reactive to light. Right eye exhibits no discharge. Left eye exhibits no discharge.  Neck: Normal range of motion. Neck  supple.  No stridor Handling secretions without difficulty No nuchal rigidity No cervical lymphadenopathy Cardiovascular: Normal rate, regular rhythm and normal heart sounds.   Pulmonary/Chest: Effort normal. No respiratory distress.  Equal chest rise  Abdominal: Soft. Bowel sounds are normal. Pt exhibits no distension. There is no tenderness.  Lymphadenopathy: Pt has no cervical adenopathy.  Neurological: Pt is alert and oriented x 4  Skin: Skin is warm and dry.  Psychiatric: Pt has a normal mood and affect.  Nursing note and vitals reviewed.   ED Treatments / Results  Labs (all labs ordered are listed, but only abnormal results are displayed) Labs Reviewed  RESP PANEL BY RT-PCR (RSV, FLU A&B, COVID)  [id number]    EKG    Radiology No results found.  Procedures Procedures (including critical care time)  Medications Ordered in ED Medications  acetaminophen  (TYLENOL ) tablet 1,000 mg (has no administration in time range)     Initial Impression / Assessment and Plan / ED Course  I have reviewed the triage vital signs and the nursing notes.  Pertinent labs & imaging results that were available during my care of the patient were reviewed by me and considered in my medical decision making (see chart for details).        Patient presents to emergency department with symptoms that are best explained by viral illness I considered emergent etiologies such as pulmonary embolism, myocarditis, sepsis I think this to be less likely.  I have given the patient strict return precautions to the emergency room for any new or concerning symptoms a specific worsening yellowing of eyes abdominal pain or chest pain or specifically any neck stiffness.  Will provide patient with a dose of Tylenol  and order a respiratory panel.  Given this patient's reassuring history and physical exam I will recommend Tylenol  ibuprofen hydration and rest.  Notably this patient does have a history of HIV  however he is on Biktarvy  states compliance with this medication and states he has undetectable viral load and I see that he follows with infectious disease.  Will treat him as immunocompetent patient in this scenario especially with how well he looks.  I did tell him to have a very low threshold to return to the emergency department.   Final Clinical Impressions(s) / ED Diagnoses   Final diagnoses:  Viral syndrome    ED Discharge Orders     None         Neldon Hamp RAMAN, [place] 12/24/24 1017    Neldon Hamp Ione, [place] 12/24/24 1035     [1]  Social History Tobacco Use   Smoking status: Every Day    Current packs/day: 0.50    Types: Cigarettes   Smokeless tobacco: Never  Vaping Use   Vaping status: Never Used  Substance Use Topics   Alcohol use: Not Currently  Alcohol/week: 2.0 standard drinks of alcohol    Types: 2 Cans of beer per week    Comment: occasional/socially   Drug use: Yes    Frequency: 7.0 times per week    Types: Marijuana     Neldon Hamp RAMAN, [place] 12/24/24 1035    Neysa Caron PARAS, DO 12/24/24 1552  "

## 2024-12-24 NOTE — ED Notes (Signed)
 Patient discharged by RN. Patient ambulatory to lobby at time of discharge without additional questions.

## 2025-02-02 ENCOUNTER — Telehealth: Payer: Self-pay

## 2025-02-02 NOTE — Telephone Encounter (Signed)
 Left patient a voice mail to call back to schedule an overdue appointment with Gregory Calone. Patient requested medication in an In Basket message.
